# Patient Record
Sex: Male | Born: 1950
Health system: Southern US, Community
[De-identification: ages and names within clinical notes are randomized; demographics above are authoritative.]

## PROBLEM LIST (undated history)

## (undated) DIAGNOSIS — E785 Hyperlipidemia, unspecified: Secondary | ICD-10-CM

## (undated) DIAGNOSIS — I10 Essential (primary) hypertension: Secondary | ICD-10-CM

## (undated) DIAGNOSIS — Z6829 Body mass index (BMI) 29.0-29.9, adult: Secondary | ICD-10-CM

## (undated) DIAGNOSIS — G4700 Insomnia, unspecified: Secondary | ICD-10-CM

## (undated) DIAGNOSIS — N400 Enlarged prostate without lower urinary tract symptoms: Secondary | ICD-10-CM

## (undated) DIAGNOSIS — R001 Bradycardia, unspecified: Secondary | ICD-10-CM

## (undated) DIAGNOSIS — F319 Bipolar disorder, unspecified: Secondary | ICD-10-CM

## (undated) DIAGNOSIS — E876 Hypokalemia: Secondary | ICD-10-CM

## (undated) HISTORY — DX: Bradycardia, unspecified: R00.1

## (undated) HISTORY — DX: Hyperlipidemia, unspecified: E78.5

## (undated) HISTORY — DX: Benign prostatic hyperplasia without lower urinary tract symptoms: N40.0

## (undated) HISTORY — PX: CHOLECYSTECTOMY: SHX55

## (undated) HISTORY — DX: Body mass index (BMI) 29.0-29.9, adult: Z68.29

## (undated) HISTORY — DX: Essential (primary) hypertension: I10

## (undated) HISTORY — DX: Bipolar disorder, unspecified: F31.9

## (undated) HISTORY — PX: APPENDECTOMY: SHX54

## (undated) HISTORY — DX: Hypokalemia: E87.6

## (undated) HISTORY — DX: Insomnia, unspecified: G47.00

---

## 2015-04-17 ENCOUNTER — Encounter: Payer: Self-pay | Admitting: Podiatry

## 2015-04-17 ENCOUNTER — Ambulatory Visit (INDEPENDENT_AMBULATORY_CARE_PROVIDER_SITE_OTHER): Payer: BLUE CROSS/BLUE SHIELD | Admitting: Podiatry

## 2015-04-17 VITALS — BP 149/97 | HR 76 | Resp 14

## 2015-04-17 DIAGNOSIS — B351 Tinea unguium: Secondary | ICD-10-CM

## 2015-04-17 NOTE — Progress Notes (Signed)
   Subjective:    Patient ID: Ronald Kerr, male    DOB: [birth year], 65 y.o.   MRN: [id number]  HPI this patient presents the office with chief complaint of a deformed big toenail, left foot. Patient states that the toenail is unattached for over a year now. He denies any history of trauma or injury to the toenail. He says that his nail has gotten thick and discolored over the last year and presents the office for an evaluation of the nail. He denies any drainage from the site. He presents the office for an evaluation and treatment  The patient presents here today with left great toenail that  Is discolored and thick since 1 year.  Review of Systems  Skin:       Change in nails       Objective:   Physical Exam GENERAL APPEARANCE: Alert, conversant. Appropriately groomed. No acute distress.  VASCULAR: Pedal pulses palpable at  [facility] and PT bilateral.  Capillary refill time is immediate to all digits,  Normal temperature gradient.  Digital hair growth is present bilateral  NEUROLOGIC: sensation is normal to 5.07 monofilament at 5/5 sites bilateral.  Light touch is intact bilateral, Muscle strength normal.  MUSCULOSKELETAL: acceptable muscle strength, tone and stability bilateral.  Intrinsic muscluature intact bilateral.  Rectus appearance of foot and digits noted bilateral.   DERMATOLOGIC: skin color, texture, and turgor are within normal limits.  No preulcerative lesions or ulcers  are seen, no interdigital maceration noted.  No open lesions present.  Digital nails are asymptomatic. No drainage noted. His left hallux toenail has become thick and u-shaped with no evidence of redness or swelling or drainage.         Assessment & Plan:  Onychomycosis Left hallux.  IE  Told patient nail has asumed this position due to previous trauma.  Debrided the nail and discussed the nail   RTC prn   Gardiner Barefoot DPM

## 2016-01-25 DIAGNOSIS — Z1211 Encounter for screening for malignant neoplasm of colon: Secondary | ICD-10-CM | POA: Diagnosis not present

## 2016-01-25 DIAGNOSIS — Z6829 Body mass index (BMI) 29.0-29.9, adult: Secondary | ICD-10-CM | POA: Diagnosis not present

## 2016-01-25 DIAGNOSIS — E785 Hyperlipidemia, unspecified: Secondary | ICD-10-CM | POA: Diagnosis not present

## 2016-01-25 DIAGNOSIS — F319 Bipolar disorder, unspecified: Secondary | ICD-10-CM | POA: Diagnosis not present

## 2016-01-25 DIAGNOSIS — G4700 Insomnia, unspecified: Secondary | ICD-10-CM | POA: Diagnosis not present

## 2016-01-25 DIAGNOSIS — Z79899 Other long term (current) drug therapy: Secondary | ICD-10-CM | POA: Diagnosis not present

## 2016-04-08 DIAGNOSIS — Z1211 Encounter for screening for malignant neoplasm of colon: Secondary | ICD-10-CM | POA: Diagnosis not present

## 2016-04-08 DIAGNOSIS — Z1212 Encounter for screening for malignant neoplasm of rectum: Secondary | ICD-10-CM | POA: Diagnosis not present

## 2016-05-09 DIAGNOSIS — K7689 Other specified diseases of liver: Secondary | ICD-10-CM | POA: Diagnosis not present

## 2016-05-09 DIAGNOSIS — R1084 Generalized abdominal pain: Secondary | ICD-10-CM | POA: Diagnosis not present

## 2016-05-09 DIAGNOSIS — R1033 Periumbilical pain: Secondary | ICD-10-CM | POA: Diagnosis not present

## 2016-05-09 DIAGNOSIS — K8050 Calculus of bile duct without cholangitis or cholecystitis without obstruction: Secondary | ICD-10-CM | POA: Diagnosis not present

## 2016-05-16 DIAGNOSIS — K8010 Calculus of gallbladder with chronic cholecystitis without obstruction: Secondary | ICD-10-CM | POA: Insufficient documentation

## 2016-05-27 DIAGNOSIS — Z79891 Long term (current) use of opiate analgesic: Secondary | ICD-10-CM | POA: Diagnosis not present

## 2016-05-27 DIAGNOSIS — K8010 Calculus of gallbladder with chronic cholecystitis without obstruction: Secondary | ICD-10-CM | POA: Diagnosis not present

## 2016-05-27 DIAGNOSIS — Z79899 Other long term (current) drug therapy: Secondary | ICD-10-CM | POA: Diagnosis not present

## 2016-05-27 DIAGNOSIS — Z9049 Acquired absence of other specified parts of digestive tract: Secondary | ICD-10-CM | POA: Diagnosis not present

## 2016-08-12 DIAGNOSIS — Z1389 Encounter for screening for other disorder: Secondary | ICD-10-CM | POA: Diagnosis not present

## 2016-08-12 DIAGNOSIS — Z79899 Other long term (current) drug therapy: Secondary | ICD-10-CM | POA: Diagnosis not present

## 2016-08-12 DIAGNOSIS — Z6829 Body mass index (BMI) 29.0-29.9, adult: Secondary | ICD-10-CM | POA: Diagnosis not present

## 2016-08-12 DIAGNOSIS — E785 Hyperlipidemia, unspecified: Secondary | ICD-10-CM | POA: Diagnosis not present

## 2016-08-12 DIAGNOSIS — G4700 Insomnia, unspecified: Secondary | ICD-10-CM | POA: Diagnosis not present

## 2016-08-12 DIAGNOSIS — F319 Bipolar disorder, unspecified: Secondary | ICD-10-CM | POA: Diagnosis not present

## 2016-08-12 DIAGNOSIS — Z9181 History of falling: Secondary | ICD-10-CM | POA: Diagnosis not present

## 2016-08-12 DIAGNOSIS — E663 Overweight: Secondary | ICD-10-CM | POA: Diagnosis not present

## 2017-02-05 DIAGNOSIS — G4700 Insomnia, unspecified: Secondary | ICD-10-CM | POA: Diagnosis not present

## 2017-02-05 DIAGNOSIS — F319 Bipolar disorder, unspecified: Secondary | ICD-10-CM | POA: Diagnosis not present

## 2017-02-05 DIAGNOSIS — Z79899 Other long term (current) drug therapy: Secondary | ICD-10-CM | POA: Diagnosis not present

## 2017-02-05 DIAGNOSIS — Z6829 Body mass index (BMI) 29.0-29.9, adult: Secondary | ICD-10-CM | POA: Diagnosis not present

## 2017-02-05 DIAGNOSIS — E785 Hyperlipidemia, unspecified: Secondary | ICD-10-CM | POA: Diagnosis not present

## 2017-08-06 DIAGNOSIS — Z23 Encounter for immunization: Secondary | ICD-10-CM | POA: Diagnosis not present

## 2017-08-06 DIAGNOSIS — Z79899 Other long term (current) drug therapy: Secondary | ICD-10-CM | POA: Diagnosis not present

## 2017-08-06 DIAGNOSIS — F319 Bipolar disorder, unspecified: Secondary | ICD-10-CM | POA: Diagnosis not present

## 2017-08-06 DIAGNOSIS — Z1339 Encounter for screening examination for other mental health and behavioral disorders: Secondary | ICD-10-CM | POA: Diagnosis not present

## 2017-08-06 DIAGNOSIS — E785 Hyperlipidemia, unspecified: Secondary | ICD-10-CM | POA: Diagnosis not present

## 2017-08-06 DIAGNOSIS — G4700 Insomnia, unspecified: Secondary | ICD-10-CM | POA: Diagnosis not present

## 2017-08-06 DIAGNOSIS — Z6828 Body mass index (BMI) 28.0-28.9, adult: Secondary | ICD-10-CM | POA: Diagnosis not present

## 2017-11-21 DIAGNOSIS — Z6827 Body mass index (BMI) 27.0-27.9, adult: Secondary | ICD-10-CM | POA: Diagnosis not present

## 2017-11-21 DIAGNOSIS — K5909 Other constipation: Secondary | ICD-10-CM | POA: Diagnosis not present

## 2017-12-01 DIAGNOSIS — Z6827 Body mass index (BMI) 27.0-27.9, adult: Secondary | ICD-10-CM | POA: Diagnosis not present

## 2017-12-01 DIAGNOSIS — K5909 Other constipation: Secondary | ICD-10-CM | POA: Diagnosis not present

## 2017-12-10 DIAGNOSIS — Z0000 Encounter for general adult medical examination without abnormal findings: Secondary | ICD-10-CM | POA: Diagnosis not present

## 2017-12-10 DIAGNOSIS — Z9181 History of falling: Secondary | ICD-10-CM | POA: Diagnosis not present

## 2017-12-10 DIAGNOSIS — Z139 Encounter for screening, unspecified: Secondary | ICD-10-CM | POA: Diagnosis not present

## 2017-12-10 DIAGNOSIS — E785 Hyperlipidemia, unspecified: Secondary | ICD-10-CM | POA: Diagnosis not present

## 2017-12-10 DIAGNOSIS — Z1331 Encounter for screening for depression: Secondary | ICD-10-CM | POA: Diagnosis not present

## 2017-12-10 DIAGNOSIS — Z23 Encounter for immunization: Secondary | ICD-10-CM | POA: Diagnosis not present

## 2017-12-10 DIAGNOSIS — Z125 Encounter for screening for malignant neoplasm of prostate: Secondary | ICD-10-CM | POA: Diagnosis not present

## 2017-12-15 DIAGNOSIS — Z79899 Other long term (current) drug therapy: Secondary | ICD-10-CM | POA: Diagnosis not present

## 2017-12-15 DIAGNOSIS — E663 Overweight: Secondary | ICD-10-CM | POA: Diagnosis not present

## 2017-12-15 DIAGNOSIS — K5909 Other constipation: Secondary | ICD-10-CM | POA: Diagnosis not present

## 2017-12-15 DIAGNOSIS — Z6827 Body mass index (BMI) 27.0-27.9, adult: Secondary | ICD-10-CM | POA: Diagnosis not present

## 2018-01-04 DIAGNOSIS — R1030 Lower abdominal pain, unspecified: Secondary | ICD-10-CM | POA: Diagnosis not present

## 2018-01-04 DIAGNOSIS — R1084 Generalized abdominal pain: Secondary | ICD-10-CM | POA: Diagnosis not present

## 2018-01-04 DIAGNOSIS — K5900 Constipation, unspecified: Secondary | ICD-10-CM | POA: Diagnosis not present

## 2018-02-05 DIAGNOSIS — F319 Bipolar disorder, unspecified: Secondary | ICD-10-CM | POA: Diagnosis not present

## 2018-02-05 DIAGNOSIS — Z79899 Other long term (current) drug therapy: Secondary | ICD-10-CM | POA: Diagnosis not present

## 2018-02-05 DIAGNOSIS — F419 Anxiety disorder, unspecified: Secondary | ICD-10-CM | POA: Diagnosis not present

## 2018-02-05 DIAGNOSIS — K5909 Other constipation: Secondary | ICD-10-CM | POA: Diagnosis not present

## 2018-02-05 DIAGNOSIS — G4700 Insomnia, unspecified: Secondary | ICD-10-CM | POA: Diagnosis not present

## 2018-02-05 DIAGNOSIS — Z6827 Body mass index (BMI) 27.0-27.9, adult: Secondary | ICD-10-CM | POA: Diagnosis not present

## 2018-02-05 DIAGNOSIS — E785 Hyperlipidemia, unspecified: Secondary | ICD-10-CM | POA: Diagnosis not present

## 2018-03-05 DIAGNOSIS — K5900 Constipation, unspecified: Secondary | ICD-10-CM | POA: Diagnosis not present

## 2018-04-02 DIAGNOSIS — K581 Irritable bowel syndrome with constipation: Secondary | ICD-10-CM | POA: Diagnosis not present

## 2018-08-06 DIAGNOSIS — Z131 Encounter for screening for diabetes mellitus: Secondary | ICD-10-CM | POA: Diagnosis not present

## 2018-08-06 DIAGNOSIS — E785 Hyperlipidemia, unspecified: Secondary | ICD-10-CM | POA: Diagnosis not present

## 2018-08-06 DIAGNOSIS — F419 Anxiety disorder, unspecified: Secondary | ICD-10-CM | POA: Diagnosis not present

## 2018-08-06 DIAGNOSIS — Z6827 Body mass index (BMI) 27.0-27.9, adult: Secondary | ICD-10-CM | POA: Diagnosis not present

## 2018-08-06 DIAGNOSIS — Z79899 Other long term (current) drug therapy: Secondary | ICD-10-CM | POA: Diagnosis not present

## 2018-08-06 DIAGNOSIS — N400 Enlarged prostate without lower urinary tract symptoms: Secondary | ICD-10-CM | POA: Diagnosis not present

## 2018-08-06 DIAGNOSIS — G4700 Insomnia, unspecified: Secondary | ICD-10-CM | POA: Diagnosis not present

## 2018-08-06 DIAGNOSIS — F319 Bipolar disorder, unspecified: Secondary | ICD-10-CM | POA: Diagnosis not present

## 2018-08-06 DIAGNOSIS — E663 Overweight: Secondary | ICD-10-CM | POA: Diagnosis not present

## 2018-10-21 DIAGNOSIS — Z79899 Other long term (current) drug therapy: Secondary | ICD-10-CM | POA: Diagnosis not present

## 2018-10-21 DIAGNOSIS — G20 Parkinson's disease: Secondary | ICD-10-CM | POA: Diagnosis not present

## 2018-10-21 DIAGNOSIS — F319 Bipolar disorder, unspecified: Secondary | ICD-10-CM | POA: Diagnosis not present

## 2018-10-21 DIAGNOSIS — Z6826 Body mass index (BMI) 26.0-26.9, adult: Secondary | ICD-10-CM | POA: Diagnosis not present

## 2018-11-19 DIAGNOSIS — G20 Parkinson's disease: Secondary | ICD-10-CM | POA: Diagnosis not present

## 2018-11-19 DIAGNOSIS — Z79899 Other long term (current) drug therapy: Secondary | ICD-10-CM | POA: Diagnosis not present

## 2018-11-19 DIAGNOSIS — Z6826 Body mass index (BMI) 26.0-26.9, adult: Secondary | ICD-10-CM | POA: Diagnosis not present

## 2018-12-03 DIAGNOSIS — Z5320 Procedure and treatment not carried out because of patient's decision for unspecified reasons: Secondary | ICD-10-CM | POA: Diagnosis not present

## 2018-12-03 DIAGNOSIS — K581 Irritable bowel syndrome with constipation: Secondary | ICD-10-CM | POA: Diagnosis not present

## 2018-12-10 ENCOUNTER — Encounter: Payer: Self-pay | Admitting: Neurology

## 2018-12-10 ENCOUNTER — Telehealth: Payer: Self-pay

## 2018-12-10 ENCOUNTER — Ambulatory Visit: Payer: PPO | Admitting: Neurology

## 2018-12-10 ENCOUNTER — Other Ambulatory Visit: Payer: Self-pay

## 2018-12-10 VITALS — BP 168/83 | HR 64 | Temp 98.2°F | Ht 73.0 in | Wt 199.0 lb

## 2018-12-10 DIAGNOSIS — G2119 Other drug induced secondary parkinsonism: Secondary | ICD-10-CM

## 2018-12-10 NOTE — Telephone Encounter (Signed)
Pt completed consent for insurance auth for DaT Scan. Given to [place] [facility] for processing.

## 2018-12-10 NOTE — Patient Instructions (Signed)
You have a mild degree of parkinsonism, changes in your gait and posture, intermittent tremors. I think you may have developed parkinsonian features from taking the Zyprexa for years.  Even her new medication, Vraylar May cause parkinsonism.  Your history and your examination on telltale for what we call idiopathic Parkinson's disease, or "real" (true) Parkinson's disease.  Please remember to stay well-hydrated with water and well rested. Talk to your primary care physician, Dr. Megan Salon about medical management.  Perhaps a consultation with a geriatric psychiatrist would help. I would like to proceed with a DaT scan: This is a specialized brain scan designed to help with diagnosis of tremor disorders. A radioactive marker gets injected and the uptake is measured in the brain and compared to normal controls and right side is compared to the left, a change in uptake can help with diagnosis of certain tremor disorders. A brain MRI on the other hand is a brain scan that helps look at the brain structure in more detail overall and look for age-related changes, blood vessel related changes and look for stroke and volume loss which we call atrophy. I would like to reevaluate you in 3 months.

## 2018-12-10 NOTE — Progress Notes (Signed)
Subjective:    Patient ID: Ronald Kerr is a 68 y.o. male.  HPI     Star Age, MD, PhD [facility] [address] [place], [place] [address]  Dear Dr. Megan Salon, I saw your patient, Ronald Kerr, upon your kind request to my neurologic clinic today for initial consultation of his parkinsonism.  The patient is unaccompanied today.  As you know, [name] is a 68 year old right-handed gentleman with an underlying medical history of bipolar disease, hyperlipidemia, BPH, overweight state, and insomnia, who reports a several month history of difficulty with his gait and balance and trembling.  His wife provides most of his information.  Patient is very quiet and minimal in his information, he is slow to respond, wife reports that he has had progressive issues with slowness and difficulty with fine motor skills for months, if not years.  Of note, he has been on Zyprexa for at least 5 years and this was recently stopped.  He was on Zyprexa even 10 years ago but then stopped it and restarted it.  I reviewed your office note from 10/21/2018.  He had blood work through your office which I reviewed: CBC with differential was unremarkable, CMP showed BUN of 17 and creatinine of 0.98.  TSH was 1.17.  Of note, he has been started on Vraylar some 6 weeks ago or so.  He has also been started on Sinemet which is currently 25-100 mg strength 1 pill 3 times daily.  He has not noticed any significant improvement since starting the Sinemet, his wife believes that perhaps his tremor is a little bit better.  He has trembling in all 4 extremities, primarily in the legs.  He has not fallen recently but felt some months ago.  He is no longer on Prozac.  He has no family history of tremors or Parkinson's disease.  He lives with his wife.  He had a son from his previous marriage, son passed away at age 4 from a ruptured aortic aneurysm.  The patient does not hydrate well with water, he  and his wife estimates that he drinks maybe 1 cup of water per day, he drinks soda, 1 can/day and some milk with cereal in the morning.  She reports that he has ongoing issues with panic and anxiety and depression, lack of motivation.   He is a non-smoker, he does not currently drink any alcohol with the exception of a rare beer.  He is retired, used to Psychologist, forensic.  His Past Medical History Is Significant For: Past Medical History:  Diagnosis Date  . Bipolar disorder ([place])   . BPH without obstruction/lower urinary tract symptoms   . Hyperlipidemia   . Insomnia     His Past Surgical History Is Significant For: Past Surgical History:  Procedure Laterality Date  . APPENDECTOMY    . CHOLECYSTECTOMY      His Family History Is Significant For: Family History  Problem Relation Age of Onset  . Breast cancer Mother     His Social History Is Significant For: Social History   Socioeconomic History  . Marital status: Married    Spouse name: Not on file  . Number of children: Not on file  . Years of education: Not on file  . Highest education level: Not on file  Occupational History  . Not on file  Social Needs  . Financial resource strain: Not on file  . Food insecurity    Worry: Not on  file    Inability: Not on file  . Transportation needs    Medical: Not on file    Non-medical: Not on file  Tobacco Use  . Smoking status: Never Smoker  . Smokeless tobacco: Never Used  Substance and Sexual Activity  . Alcohol use: Yes    Alcohol/week: 0.0 standard drinks    Comment: occas.  . Drug use: No  . Sexual activity: Not on file  Lifestyle  . Physical activity    Days per week: Not on file    Minutes per session: Not on file  . Stress: Not on file  Relationships  . Social Herbalist on phone: Not on file    Gets together: Not on file    Attends religious service: Not on file    Active member of club or organization: Not on file    Attends meetings  of clubs or organizations: Not on file    Relationship status: Not on file  Other Topics Concern  . Not on file  Social History Narrative  . Not on file    His Allergies Are:  No Known Allergies:   His Current Medications Are:  Outpatient Encounter Medications as of 12/10/2018  Medication Sig  . carbidopa-levodopa (SINEMET IR) 25-100 MG tablet Take 1 tablet by mouth 3 (three) times daily.  . Cariprazine HCl (VRAYLAR) 4.5 MG CAPS Take 4.5 mg by mouth daily.  Marland Kitchen linaclotide (LINZESS) 145 MCG CAPS capsule Take 145 mcg by mouth daily before breakfast.  . rosuvastatin (CRESTOR) 10 MG tablet Take 10 mg by mouth daily.  . [DISCONTINUED] aspirin 81 MG tablet Take 81 mg by mouth daily.  . [DISCONTINUED] FLUoxetine (PROZAC) 10 MG tablet   . [DISCONTINUED] OLANZapine (ZYPREXA) 10 MG tablet Take 10 mg by mouth at bedtime.   No facility-administered encounter medications on file as of 12/10/2018.   :   Review of Systems:  Out of a complete 14 point review of systems, all are reviewed and negative with the exception of these symptoms as listed below:  Review of Systems  Neurological:       Pt presents today to discuss if he has parkinson's disease. Pt was started on sinemet by his PCP. Pt's wife believes that his helps a little. Pt notices tremors in both of his hands. Pt is right handed. Pt stopped zyprexa about one month ago and started vraylar.    Objective:  Neurological Exam  Physical Exam Physical Examination:   Vitals:   12/10/18 1001  BP: (!) 168/83  Pulse: 64  Temp: 98.2 F (36.8 C)   General Examination: The patient is a very pleasant 68 y.o. male in no acute distress. He appears Mildly deconditioned, adequately groomed, very quiet, overall psychomotor retardation noted.  HEENT: Normocephalic, atraumatic, pupils are equal, round and reactive to light and accommodation. Extraocular tracking shows mild saccadic breakdown.  He has mild facial masking, he has mild nuchal  rigidity.  No carotid bruits.  Airway examination reveals moderate mouth dryness, tongue protrudes centrally in palate elevates symmetrically.  He has no lip, neck or jaw tremor.  No dysarthria, speech is scant.   Chest: Clear to auscultation without wheezing, rhonchi or crackles noted.  Heart: S1+S2+0, regular and normal without murmurs, rubs or gallops noted.   Abdomen: Soft, non-tender and non-distended with normal bowel sounds appreciated on auscultation.  Extremities: There is no pitting edema in the distal lower extremities bilaterally.  Skin: Warm and dry without trophic changes noted.  Musculoskeletal: exam reveals no obvious joint deformities, tenderness or joint swelling or erythema.   Neurologically:  Mental status: The patient is awake and alert, oriented, he is not able to provide a lot of details about his history.  His history is primarily provided by his wife, his mood is constricted, affect is quite blunted.  Cranial nerves are as above.   On 12/10/2018: On Archimedes spiral drawing he has no significant trembling with either hand, handwriting with The right hand which is his dominant hand is legible, not tremulous, not particularly micrographic. Motor examination reveals no obvious resting tremor in the upper extremities, he has a mildly increased tone in both upper extremities, he has an intermittent trembling in both feet but at times this appears to be more in keeping with motor restlessness or akathisia and sometimes it resembles volitional foot bobbing. Normal strength is noted, reflexes are 1+, toes are downgoing, fine motor skills and coordination: He has mild difficulty with finger taps, hand movements and rapid alternating padding, overall mild slowness is noted, no obvious lateralization.  Cerebellar testing with finger-to-nose and heel-to-shin shows no dysmetria or intention tremor.  Sensory exam: intact to light touch in the upper and lower extremities.  Gait, station  and balance: He stands up without difficulty, he is able to stand narrow based.  Romberg is negative.  His posture is mildly stooped for age, he walks slowly and has decreased arm swing bilaterally.  No obvious shuffling.  Assessment and Plan:  Assessment and Plan:  In summary, Levere Fahrer is a very pleasant 67 y.o.-year old male with an underlying medical history of bipolar disease, hyperlipidemia, BPH, overweight state, and insomnia, who Presents for evaluation of his gait, balance changes and fine motor difficulties, his history and examination are in keeping with mild degree of parkinsonism.  His clinical picture is overshadowed by Likely residual depression and anxiety.The lack of telltale  Concerning for drug-induced parkinsonism, lateralization and his presentation overall are more concerning for a drug-induced presentation rather than idiopathic Parkinson's disease.  He is advised to talk to you about medical management and potentially the possibility of seeing a geriatric psychiatrist for management of his mood disorder and alternative treatment options.He has been on Zyprexa for many years, was recently switched to [employer]. I did not suggest any new medications today.  He is not sure that he has benefited from Sinemet which is currently 3 times daily.  I would like to proceed with a special nuclear medicine brain scan called DaTscan.  We provided additional information and He is advised that this requires insurance authorization as well as a consent. Written consent was obtained today.  He and his wife are willing to proceed and we will keep him posted as to the test results.  I plan to reevaluate him in 3 months, sooner if needed.  I answered all their questions today and the patient and his wife were in agreement. Thank you very much for allowing me to participate in the care of this nice patient. If I can be of any further assistance to you please do not hesitate to call me at  [phone].  Sincerely,   Star Age, MD, PhD

## 2018-12-14 ENCOUNTER — Telehealth: Payer: Self-pay | Admitting: Neurology

## 2018-12-14 NOTE — Telephone Encounter (Signed)
Noted. Thanks Emily.  

## 2018-12-14 NOTE — Telephone Encounter (Signed)
I faxed all the information for the DAT scan. It is pending.

## 2018-12-15 DIAGNOSIS — Z0000 Encounter for general adult medical examination without abnormal findings: Secondary | ICD-10-CM | POA: Diagnosis not present

## 2018-12-15 DIAGNOSIS — Z9181 History of falling: Secondary | ICD-10-CM | POA: Diagnosis not present

## 2018-12-15 DIAGNOSIS — Z1331 Encounter for screening for depression: Secondary | ICD-10-CM | POA: Diagnosis not present

## 2018-12-15 DIAGNOSIS — Z139 Encounter for screening, unspecified: Secondary | ICD-10-CM | POA: Diagnosis not present

## 2018-12-15 DIAGNOSIS — E785 Hyperlipidemia, unspecified: Secondary | ICD-10-CM | POA: Diagnosis not present

## 2018-12-15 DIAGNOSIS — Z125 Encounter for screening for malignant neoplasm of prostate: Secondary | ICD-10-CM | POA: Diagnosis not present

## 2019-01-07 ENCOUNTER — Ambulatory Visit (HOSPITAL_COMMUNITY): Admission: RE | Admit: 2019-01-07 | Payer: PPO | Source: Ambulatory Visit

## 2019-01-07 ENCOUNTER — Encounter (HOSPITAL_COMMUNITY): Payer: PPO

## 2019-02-22 DIAGNOSIS — G20 Parkinson's disease: Secondary | ICD-10-CM | POA: Diagnosis not present

## 2019-02-22 DIAGNOSIS — E785 Hyperlipidemia, unspecified: Secondary | ICD-10-CM | POA: Diagnosis not present

## 2019-02-22 DIAGNOSIS — N400 Enlarged prostate without lower urinary tract symptoms: Secondary | ICD-10-CM | POA: Diagnosis not present

## 2019-02-22 DIAGNOSIS — Z79899 Other long term (current) drug therapy: Secondary | ICD-10-CM | POA: Diagnosis not present

## 2019-02-22 DIAGNOSIS — Z6826 Body mass index (BMI) 26.0-26.9, adult: Secondary | ICD-10-CM | POA: Diagnosis not present

## 2019-02-22 DIAGNOSIS — F319 Bipolar disorder, unspecified: Secondary | ICD-10-CM | POA: Diagnosis not present

## 2019-03-15 ENCOUNTER — Telehealth: Payer: Self-pay | Admitting: Neurology

## 2019-03-15 NOTE — Telephone Encounter (Signed)
I reached out to the pt's wife. She st's the pt would like to proceed with the DAT testing at this time. She sts back in Oct of 2020 the pt declined scheduling because he felt like he did not need it and he was getting better. Pt has since declined and would like to pursue DAT Scan. I advised I would send a message to Hinton Dyer asking if the pt consent from Oct of 2020 was still in effect so we could proceed with scheduling.  Pt has canceled f/u scheduled for 03/16/2019 until testing is completed.

## 2019-03-15 NOTE — Telephone Encounter (Signed)
Voeltz,Laura(wife on DPR) has called stating pt is not getting any better and they would like to go about having pt rescheduled for his nuclear brain scan.  Please call

## 2019-03-16 ENCOUNTER — Ambulatory Visit: Payer: PPO | Admitting: Neurology

## 2019-03-16 NOTE — Telephone Encounter (Signed)
Please ask patient how he would like to proceed, they can come in for consent and talk to you directly Hinton Dyer or we may have to mail the form to them and do the consent over the Phone, if you have done that before.

## 2019-03-16 NOTE — Telephone Encounter (Signed)
Noted I will take care of it . Thanks Hinton Dyer

## 2019-05-24 DIAGNOSIS — E785 Hyperlipidemia, unspecified: Secondary | ICD-10-CM | POA: Diagnosis not present

## 2019-05-24 DIAGNOSIS — G20 Parkinson's disease: Secondary | ICD-10-CM | POA: Diagnosis not present

## 2019-05-24 DIAGNOSIS — F319 Bipolar disorder, unspecified: Secondary | ICD-10-CM | POA: Diagnosis not present

## 2019-05-24 DIAGNOSIS — N400 Enlarged prostate without lower urinary tract symptoms: Secondary | ICD-10-CM | POA: Diagnosis not present

## 2019-05-24 DIAGNOSIS — Z6826 Body mass index (BMI) 26.0-26.9, adult: Secondary | ICD-10-CM | POA: Diagnosis not present

## 2019-05-24 DIAGNOSIS — Z79899 Other long term (current) drug therapy: Secondary | ICD-10-CM | POA: Diagnosis not present

## 2019-05-24 NOTE — Telephone Encounter (Signed)
Re- sending to insurance

## 2019-05-24 NOTE — Telephone Encounter (Signed)
Patient wife called back and relayed that she does want to start process of Dat scan now I relayed to patient's wife that this process may take up to two weeks because we will have to resubmit to insurance. [id number] .

## 2019-06-09 NOTE — Telephone Encounter (Signed)
Patient was approved Ronald Kerr DAT scan . Lovena Le has called patient to schedule.

## 2019-06-24 ENCOUNTER — Telehealth: Payer: Self-pay

## 2019-06-24 DIAGNOSIS — Z79899 Other long term (current) drug therapy: Secondary | ICD-10-CM | POA: Diagnosis not present

## 2019-06-24 DIAGNOSIS — G2401 Drug induced subacute dyskinesia: Secondary | ICD-10-CM | POA: Diagnosis not present

## 2019-06-24 DIAGNOSIS — Z6827 Body mass index (BMI) 27.0-27.9, adult: Secondary | ICD-10-CM | POA: Diagnosis not present

## 2019-06-24 NOTE — Telephone Encounter (Signed)
Call transferred from phone room staff. I spoke with Tammy with Dr. Chilton [facility]ath[facility] office. She reports the pt is currently at their office and wanted if the appt for the DAT scan has been scheduled? I reviewed the pt's chart and found where insurance has approved the scan but could not locate a date/time for the appt. I advised Tammy I would send message to referral coordinator to see about getting this scheduled.

## 2019-06-24 NOTE — Telephone Encounter (Signed)
Called and left patient a message to please call taylor at [facility] to schedule his DAT scan [phone]

## 2019-06-28 NOTE — Telephone Encounter (Signed)
Phone rep checked office voicemail's,at 9:19 pt's wife(Laura Hudelson) left a voicemail asking that [place] calls her back re: the scheduling of the DAT scan

## 2019-06-28 NOTE — Telephone Encounter (Signed)
Called back and left another message relaying for Ronald Kerr to call [phone] that's who she needs to call to get DAT scan scheduled.

## 2019-07-29 ENCOUNTER — Other Ambulatory Visit: Payer: Self-pay

## 2019-07-29 ENCOUNTER — Ambulatory Visit (HOSPITAL_COMMUNITY)
Admission: RE | Admit: 2019-07-29 | Discharge: 2019-07-29 | Disposition: A | Discharge status: Home / Self Care | Payer: PPO | Source: Ambulatory Visit | Attending: Neurology | Admitting: Neurology

## 2019-07-29 ENCOUNTER — Encounter (HOSPITAL_COMMUNITY)
Admission: RE | Admit: 2019-07-29 | Discharge: 2019-07-29 | Disposition: A | Discharge status: Home / Self Care | Payer: PPO | Source: Ambulatory Visit | Attending: Neurology | Admitting: Neurology

## 2019-07-29 DIAGNOSIS — G2119 Other drug induced secondary parkinsonism: Secondary | ICD-10-CM | POA: Insufficient documentation

## 2019-07-29 DIAGNOSIS — R251 Tremor, unspecified: Secondary | ICD-10-CM | POA: Diagnosis not present

## 2019-07-29 MED ORDER — IOFLUPANE I 123 185 MBQ/2.5ML IV SOLN
5.0100 | Freq: Once | INTRAVENOUS | Status: AC
  Administered 2019-07-29: 5.01 via INTRAVENOUS

## 2019-07-29 MED ORDER — IODINE STRONG (LUGOLS) 5 % PO SOLN
ORAL | Status: AC
  Administered 2019-07-29: 0.8 mL via ORAL
  Filled 2019-07-29: qty 1

## 2019-08-04 ENCOUNTER — Telehealth: Payer: Self-pay | Admitting: Neurology

## 2019-08-04 NOTE — Telephone Encounter (Signed)
Pt's wife Mickel Baas on [place] called wanting to know if the DAT Scan results have been received. Please advise.

## 2019-08-04 NOTE — Telephone Encounter (Signed)
I reached out out to the pt's wife and advised we have not received the final report yet but would let the pt know once we do. I also advised Dr. Rexene Alberts is out of the office until 6/21.  She verbalized understanding and appreciation for the call.

## 2019-08-05 NOTE — Telephone Encounter (Signed)
Please call patient or his wife regarding his DaTscan.  Scan results would  support underlying parkinsonism, possible Parkinson's disease.  If he had drug-induced parkinsonism his scan results should be fairly normal but his result, meaning the uptake of the radioactive marker was abnormal, which would support an underlying primary Parkinsonian syndrome. Please advise patient to discuss further during a FU appt and assist with making the appt.

## 2019-08-09 NOTE — Telephone Encounter (Signed)
I contacted the pt's wife and advised of results. She verbalized understanding and pt scheduled f/u for 08/10/2019 at 300 pm check in of 230.

## 2019-08-10 ENCOUNTER — Ambulatory Visit: Payer: PPO | Admitting: Neurology

## 2019-08-10 ENCOUNTER — Encounter: Payer: Self-pay | Admitting: Neurology

## 2019-08-10 VITALS — BP 158/87 | HR 54 | Ht 73.0 in | Wt 211.0 lb

## 2019-08-10 DIAGNOSIS — F419 Anxiety disorder, unspecified: Secondary | ICD-10-CM | POA: Diagnosis not present

## 2019-08-10 DIAGNOSIS — F329 Major depressive disorder, single episode, unspecified: Secondary | ICD-10-CM

## 2019-08-10 DIAGNOSIS — G2119 Other drug induced secondary parkinsonism: Secondary | ICD-10-CM

## 2019-08-10 NOTE — Progress Notes (Signed)
Subjective:    Patient ID: Ronald Kerr is a 69 y.o. male.  HPI     Interim history:   [name] is a 69 year old right-handed gentleman with an underlying medical history of bipolar disease, hyperlipidemia, BPH, overweight state, and insomnia, who Presents for follow-up consultation of his parkinsonism.  The patient is accompanied by his wife today.  I first met him on 12/10/2018 at the request of his primary care physician, at which time he reported a several month history of slowness, difficulty with fine motor skills, progression of balance and gait disorder and trembling.  He had evidence of parkinsonism, concerning for drug-induced parkinsonism in particular as he had been on Zyprexa for years.  I suggested we proceed with a DaTscan.  He had been on Sinemet per PCP with no telltale results.   He had a DaT scan on 07/29/19 and I reviewed the results: IMPRESSION: Asymmetric decreased striatal Ioflupane activity as above. This pattern can be seen in Parkinson's syndromes.   Of note, DaTSCAN is not diagnostic of Parkinson's syndromes. DaTscan is an adjuvant test to aid in the clinical diagnosis of Parkinson's syndrome. We called his wife with the results.   Today, 08/10/19: he reports feeling worse. He reports that his anxiety is worse.  He was stressed out about coming to the appointment today.  His wife reports that he was afraid he was going to have a panic attack.  He took 2 pills of clonazepam today.  He takes it as needed.  He has stopped all his medications including his Prozac and his Vraylar and is no longer on Zyprexa, he also stopped his Sinemet shortly after his appointment last year with me.  He stopped all his medications without asking his primary care physician.  His wife reports that they did tell Dr. Megan Salon that patient stopped all his medications.  They were supposed to see primary care physician earlier this month but the appointment was canceled twice per wife.  They do  have an appointment to see primary care physician next month.  The patient is visibly restless, has noisy breathing, reports that he feels stressed out and anxious.  He denies any significant depression at this time.  She believes that his anxiety is his main issue today.  He has not had much in the way of tremor.  Previously:   12/10/18: (He) reports a several month history of difficulty with his gait and balance and trembling.  His wife provides most of his information.  Patient is very quiet and minimal in his information, he is slow to respond, wife reports that he has had progressive issues with slowness and difficulty with fine motor skills for months, if not years.  Of note, he has been on Zyprexa for at least 5 years and this was recently stopped.  He was on Zyprexa even 10 years ago but then stopped it and restarted it.  I reviewed your office note from 10/21/2018.  He had blood work through your office which I reviewed: CBC with differential was unremarkable, CMP showed BUN of 17 and creatinine of 0.98.  TSH was 1.17.  Of note, he has been started on Vraylar some 6 weeks ago or so.  He has also been started on Sinemet which is currently 25-100 mg strength 1 pill 3 times daily.  He has not noticed any significant improvement since starting the Sinemet, his wife believes that perhaps his tremor is a little bit better.  He has trembling in all 4  extremities, primarily in the legs.  He has not fallen recently but felt some months ago.  He is no longer on Prozac.  He has no family history of tremors or Parkinson's disease.  He lives with his wife.  He had a son from his previous marriage, son passed away at age 50 from a ruptured aortic aneurysm.  The patient does not hydrate well with water, he and his wife estimates that he drinks maybe 1 cup of water per day, he drinks soda, 1 can/day and some milk with cereal in the morning.  She reports that he has ongoing issues with panic and anxiety and depression,  lack of motivation.   He is a non-smoker, he does not currently drink any alcohol with the exception of a rare beer.  He is retired, used to Psychologist, forensic.  His Past Medical History Is Significant For: Past Medical History:  Diagnosis Date  . Bipolar disorder ([place])   . BPH without obstruction/lower urinary tract symptoms   . Hyperlipidemia   . Insomnia     His Past Surgical History Is Significant For: Past Surgical History:  Procedure Laterality Date  . APPENDECTOMY    . CHOLECYSTECTOMY      His Family History Is Significant For: Family History  Problem Relation Age of Onset  . Breast cancer Mother     His Social History Is Significant For: Social History   Socioeconomic History  . Marital status: Married    Spouse name: Not on file  . Number of children: Not on file  . Years of education: Not on file  . Highest education level: Not on file  Occupational History  . Not on file  Tobacco Use  . Smoking status: Never Smoker  . Smokeless tobacco: Never Used  Substance and Sexual Activity  . Alcohol use: Yes    Alcohol/week: 0.0 standard drinks    Comment: occas.  . Drug use: No  . Sexual activity: Not on file  Other Topics Concern  . Not on file  Social History Narrative  . Not on file   Social Determinants of Health   Financial Resource Strain:   . Difficulty of Paying Living Expenses:   Food Insecurity:   . Worried About Charity fundraiser in the Last Year:   . Arboriculturist in the Last Year:   Transportation Needs:   . Film/video editor (Medical):   Marland Kitchen Lack of Transportation (Non-Medical):   Physical Activity:   . Days of Exercise per Week:   . Minutes of Exercise per Session:   Stress:   . Feeling of Stress :   Social Connections:   . Frequency of Communication with Friends and Family:   . Frequency of Social Gatherings with Friends and Family:   . Attends Religious Services:   . Active Member of Clubs or Organizations:   .  Attends Archivist Meetings:   Marland Kitchen Marital Status:     His Allergies Are:  No Known Allergies:   His Current Medications Are:  Outpatient Encounter Medications as of 08/10/2019  Medication Sig  . rosuvastatin (CRESTOR) 10 MG tablet Take 10 mg by mouth daily.  . [DISCONTINUED] carbidopa-levodopa (SINEMET IR) 25-100 MG tablet Take 1 tablet by mouth 3 (three) times daily.  . [DISCONTINUED] Cariprazine HCl (VRAYLAR) 4.5 MG CAPS Take 4.5 mg by mouth daily.  . [DISCONTINUED] linaclotide (LINZESS) 145 MCG CAPS capsule Take 145 mcg by mouth daily before breakfast.   No  facility-administered encounter medications on file as of 08/10/2019.  :  Review of Systems:  Out of a complete 14 point review of systems, all are reviewed and negative with the exception of these symptoms as listed below: Review of Systems  Neurological:       Here today to follow up on his DAT test results. States his symptoms have not changed since last visit. Per his wife, his anxiety has increased since the last visit. Has stopped several of his medications.     Objective:  Neurological Exam  Physical Exam Physical Examination:   Vitals:   08/10/19 1515  BP: (!) 158/87  Pulse: (!) 54    General Examination: The patient is a very pleasant 69 y.o. male in no acute distress. He appears restless and mildly agitated, he appears anxious.  He is more verbal and interactive actually than last time.   HEENT: Normocephalic, atraumatic, pupils are equal, round and reactive to light and accommodation. Extraocular tracking shows mild saccadic breakdown.  He has mild facial masking, he has mild nuchal rigidity.  No carotid bruits.  Airway examination reveals mild mouth dryness, tongue protrudes centrally in palate elevates symmetrically.  He has no lip, neck or jaw tremor.  No dysarthria, speech is normal, a little bit more talkative than last time.  No obvious tongue dyskinesias, he breathes through his mouth.   Chest:  Clear to auscultation without wheezing, rhonchi or crackles noted.  Heart: S1+S2+0, regular and normal without murmurs, rubs or gallops noted.   Abdomen: Soft, non-tender and non-distended.  Extremities: There is no edema in the distal lower extremities bilaterally.  Skin: Warm and dry without trophic changes noted.  Musculoskeletal: exam reveals no obvious joint deformities, tenderness or joint swelling or erythema.   Neurologically:  Mental status: The patient is awake and alert, more interactive and more talkative compared to last time.   Cranial nerves are as above.   (On 12/10/2018: On Archimedes spiral drawing he has no significant trembling with either hand, handwriting with The right hand which is his dominant hand is legible, not tremulous, not particularly micrographic.)   Motor examination reveals no obvious resting tremor in the upper extremities, has no significant increase in tone, no significant postural tremor, no obvious resting tremor.  He is somewhat restless motor wise generally speaking Fine motor skills are not significantly impaired and no obvious lateralization is noted.  He stands up somewhat slowly and stands slightly wide-based, he walks with preserved arm swing, no shuffling noted.   Sensory exam: intact to light touch in the upper and lower extremities.   Assessment and Plan:  In summary, Erle Guster is a very pleasant 69 year old male with an underlying medical history of bipolar disease, hyperlipidemia, BPH, overweight state, and insomnia, who Presents for follow-up consultation of his gait and balance problems and fine motor difficulties as well as trembling.  Last year in October he had some signs of parkinsonism on examination which are not apparent today.  He certainly does not have any significant parkinsonian symptoms or telltale lateralization.  I was suspecting drug-induced parkinsonism last year.  We recently did a DaTscan which showed  abnormality in the uptake.  I am perplexed by the results especially since clinically he does not have any obvious parkinsonism or classic signs of idiopathic Parkinson's disease today.  I would like to continue to monitor his symptoms and examination.  More pressing issue currently is his increase in anxiety and some depression.  His anxiety and  stress level have certainly increased.  He is strongly discouraged from discontinuing psychotropic medications abruptly.  It sounds like he stopped most of his medications last year.  He has an appointment with his primary care physician coming up earlier in the month next month but they are encouraged to call Dr. Hale Bogus office for sooner than scheduled appointment if possible as he is endorsing quite a bit of stress and increase in anxiety since stopping his medications.  He has also stopped Sinemet which he was on for a short period of time last year per PCP.  I would favor that he continues to stay off of Sinemet for now.  I would like to reevaluate him in about 3 to 4 months, sooner if needed.  He was offered a referral to psychiatry to facilitate help for his mood disorder but he declined it and his wife is also declining this today.   When he was on Sinemet last year he does not remember that it helped him.  He reported last year that he did not notice any significant benefit from it.   We mutually agreed to follow him clinically.  His wife was agreeable to call primary care physician today to see if he can be seen sooner than scheduled. I answered all their questions today and the patient and his wife were in agreement. I spent 20 minutes in total face-to-face time and in reviewing records during pre-charting, more than 50% of which was spent in counseling and coordination of care, reviewing test results, reviewing medications and treatment regimen and/or in discussing or reviewing the diagnosis of drug-induced parkinsonism, the prognosis and treatment  options. Pertinent laboratory and imaging test results that were available during this visit with the patient were reviewed by me and considered in my medical decision making (see chart for details).

## 2019-08-10 NOTE — Patient Instructions (Signed)
Even though your DaTscan was reported to be abnormal and supportive of an underlying Parkinson's syndrome I do not see any significant signs of parkinsonism today or classic signs of Parkinson's disease.  Therefore, as explained I would like to continue to monitor your symptoms and your examination.  Please make a follow-up appointment with your primary care physician, Dr. Megan Salon as soon as possible as your anxiety and depression have increased since you stopped your medications last year.  I think managing your anxiety and depression are much more pressing issues currently.  Please follow-up with me in about 4 months for a reevaluation of your neurological symptoms and signs.

## 2019-08-16 DIAGNOSIS — Z6827 Body mass index (BMI) 27.0-27.9, adult: Secondary | ICD-10-CM | POA: Diagnosis not present

## 2019-08-16 DIAGNOSIS — F319 Bipolar disorder, unspecified: Secondary | ICD-10-CM | POA: Diagnosis not present

## 2019-08-16 DIAGNOSIS — F411 Generalized anxiety disorder: Secondary | ICD-10-CM | POA: Diagnosis not present

## 2019-08-16 DIAGNOSIS — Z79899 Other long term (current) drug therapy: Secondary | ICD-10-CM | POA: Diagnosis not present

## 2019-10-19 DIAGNOSIS — F319 Bipolar disorder, unspecified: Secondary | ICD-10-CM | POA: Diagnosis not present

## 2019-10-19 DIAGNOSIS — Z79899 Other long term (current) drug therapy: Secondary | ICD-10-CM | POA: Diagnosis not present

## 2019-10-19 DIAGNOSIS — Z6827 Body mass index (BMI) 27.0-27.9, adult: Secondary | ICD-10-CM | POA: Diagnosis not present

## 2019-10-19 DIAGNOSIS — F0390 Unspecified dementia without behavioral disturbance: Secondary | ICD-10-CM | POA: Diagnosis not present

## 2019-10-19 DIAGNOSIS — F411 Generalized anxiety disorder: Secondary | ICD-10-CM | POA: Diagnosis not present

## 2019-12-02 DIAGNOSIS — Z79899 Other long term (current) drug therapy: Secondary | ICD-10-CM | POA: Diagnosis not present

## 2019-12-02 DIAGNOSIS — Z6825 Body mass index (BMI) 25.0-25.9, adult: Secondary | ICD-10-CM | POA: Diagnosis not present

## 2019-12-02 DIAGNOSIS — M6240 Contracture of muscle, unspecified site: Secondary | ICD-10-CM | POA: Diagnosis not present

## 2019-12-02 DIAGNOSIS — G20 Parkinson's disease: Secondary | ICD-10-CM | POA: Diagnosis not present

## 2019-12-02 DIAGNOSIS — K5909 Other constipation: Secondary | ICD-10-CM | POA: Diagnosis not present

## 2019-12-06 DIAGNOSIS — K5900 Constipation, unspecified: Secondary | ICD-10-CM | POA: Diagnosis not present

## 2019-12-06 DIAGNOSIS — M47816 Spondylosis without myelopathy or radiculopathy, lumbar region: Secondary | ICD-10-CM | POA: Diagnosis not present

## 2019-12-06 DIAGNOSIS — K5901 Slow transit constipation: Secondary | ICD-10-CM | POA: Diagnosis not present

## 2019-12-06 DIAGNOSIS — K6289 Other specified diseases of anus and rectum: Secondary | ICD-10-CM | POA: Diagnosis not present

## 2019-12-06 DIAGNOSIS — R109 Unspecified abdominal pain: Secondary | ICD-10-CM | POA: Diagnosis not present

## 2019-12-13 ENCOUNTER — Ambulatory Visit: Payer: PPO | Admitting: Neurology

## 2020-01-31 DIAGNOSIS — F319 Bipolar disorder, unspecified: Secondary | ICD-10-CM | POA: Diagnosis not present

## 2020-01-31 DIAGNOSIS — Z79899 Other long term (current) drug therapy: Secondary | ICD-10-CM | POA: Diagnosis not present

## 2020-01-31 DIAGNOSIS — F0390 Unspecified dementia without behavioral disturbance: Secondary | ICD-10-CM | POA: Diagnosis not present

## 2020-01-31 DIAGNOSIS — F411 Generalized anxiety disorder: Secondary | ICD-10-CM | POA: Diagnosis not present

## 2020-01-31 DIAGNOSIS — G20 Parkinson's disease: Secondary | ICD-10-CM | POA: Diagnosis not present

## 2020-01-31 DIAGNOSIS — E785 Hyperlipidemia, unspecified: Secondary | ICD-10-CM | POA: Diagnosis not present

## 2020-01-31 DIAGNOSIS — Z9181 History of falling: Secondary | ICD-10-CM | POA: Diagnosis not present

## 2020-01-31 DIAGNOSIS — Z6825 Body mass index (BMI) 25.0-25.9, adult: Secondary | ICD-10-CM | POA: Diagnosis not present

## 2020-01-31 DIAGNOSIS — Z139 Encounter for screening, unspecified: Secondary | ICD-10-CM | POA: Diagnosis not present

## 2020-07-17 IMAGING — NM NM DATSCAN
4 series · 33 of 40 positions shown · non-contrast
Comparison: None.

CLINICAL DATA: 68-year-old male with tremors and balance issues.

EXAM:
NUCLEAR MEDICINE BRAIN IMAGING WITH SPECT  (DaTscan )
TECHNIQUE: SPECT images of the brain were obtained after intravenous injection
of radiopharmaceutical. 4 hour post injection imaging. Appropriate
positioning. 0.8 ml lugols solution administered in a.m
RADIOPHARMACEUTICALS:  5.0 millicuries I 123 Ioflupane

[Series 1: spect - (id) _(id)_cor · 4.1mm · 4.14mm/px · 5 of 128 frames shown]
[frame 11/128]
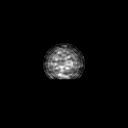
[frame 32/128]
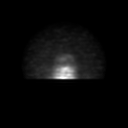
[frame 75/128]
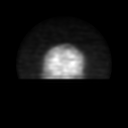
[frame 96/128]
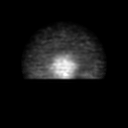
[frame 118/128]
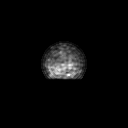

[Series 1: spect - (id) _(id)_tra · 4.1mm · 4.14mm/px · 5 of 128 frames shown]
[frame 11/128]
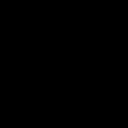
[frame 32/128]
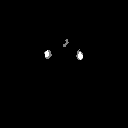
[frame 75/128]
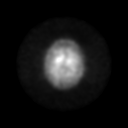
[frame 96/128]
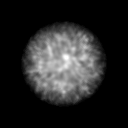
[frame 118/128]
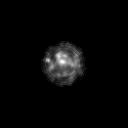

[Series 2: brain spect · 4.14mm/px · 5 of 120 frames shown]
[frame 11/120  full-range]
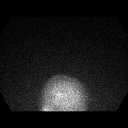
[frame 31/120  full-range]
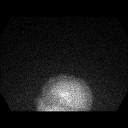
[frame 71/120  full-range]
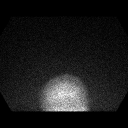
[frame 91/120  full-range]
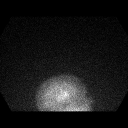
[frame 111/120  full-range]
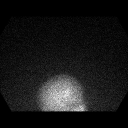

[Series 1013: mpr (id) range · 0.73mm/px · 18 of 44 slices shown]
[im 1/44]
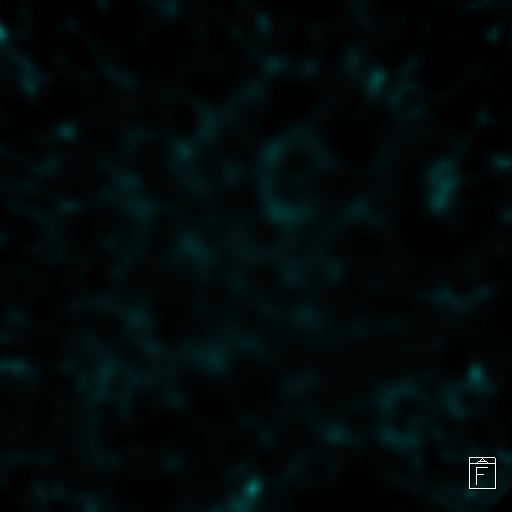
[im 5/44]
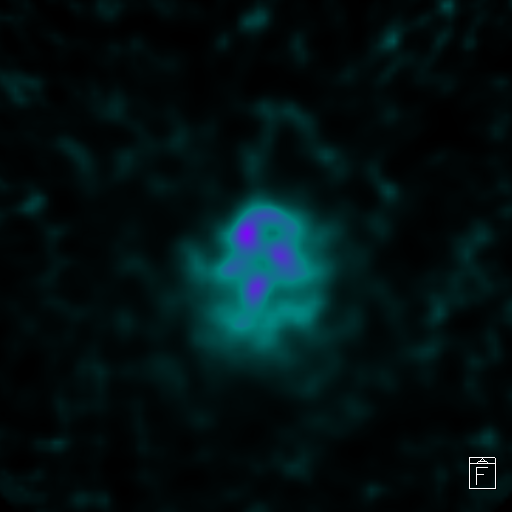
[im 7/44]
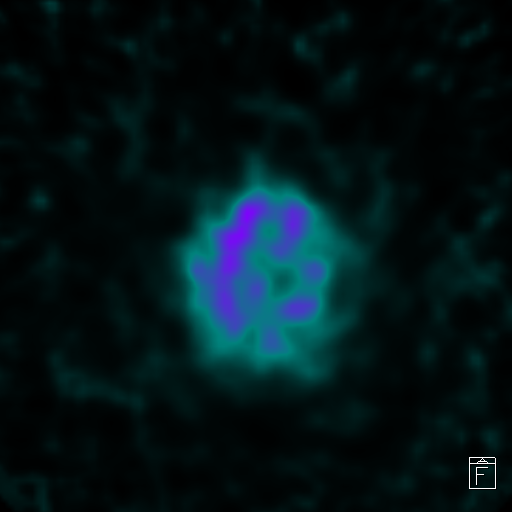
[im 9/44]
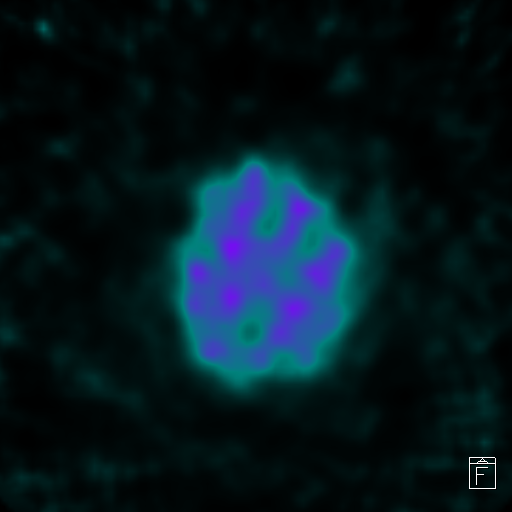
[im 11/44]
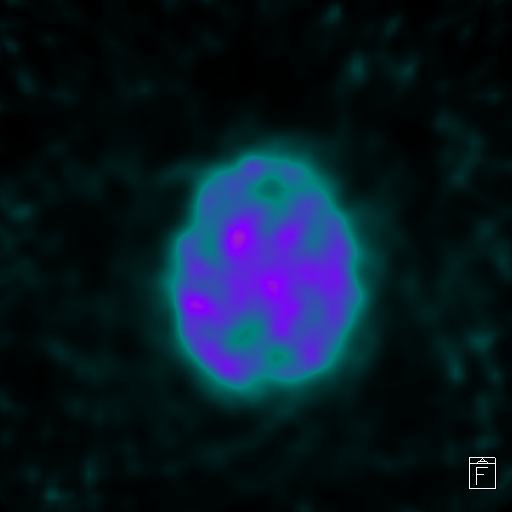
[im 13/44]
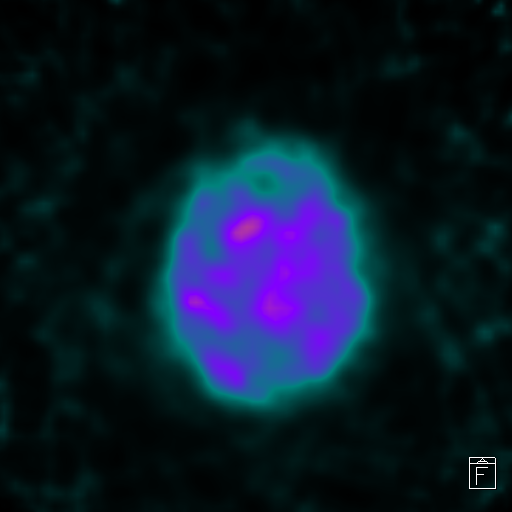
[im 17/44]
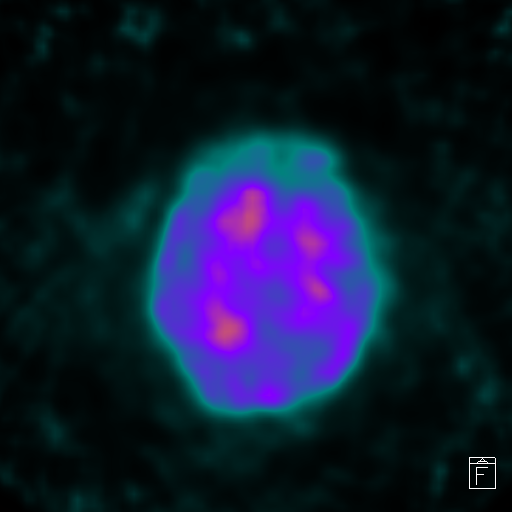
[im 19/44]
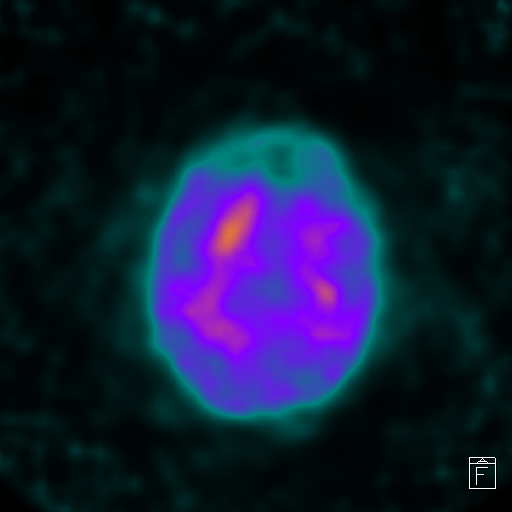
[im 21/44]
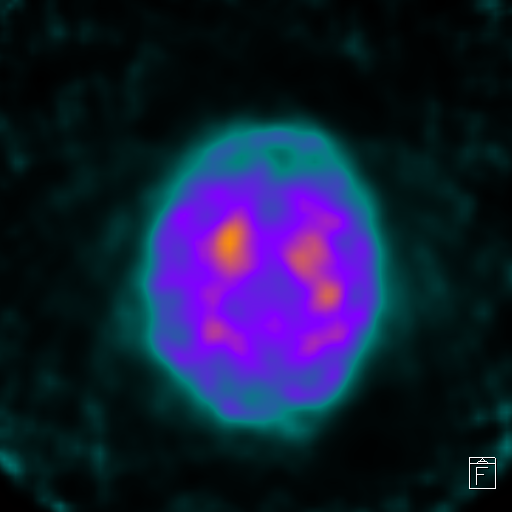
[im 23/44]
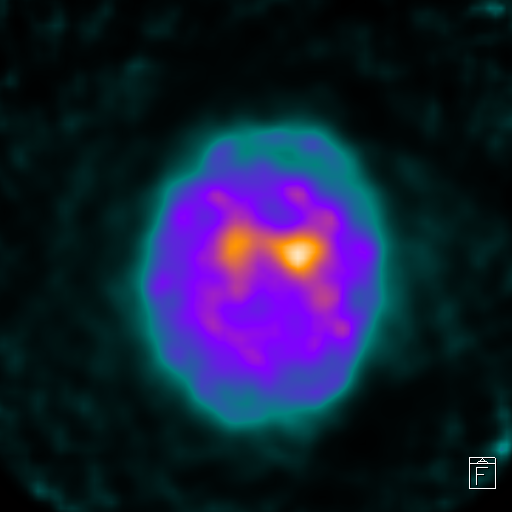
[im 25/44]
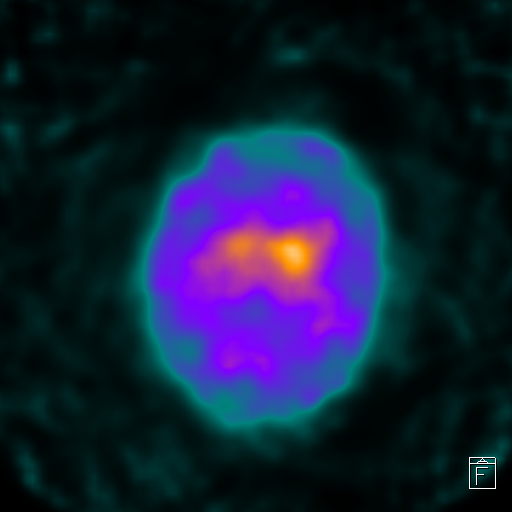
[im 29/44]
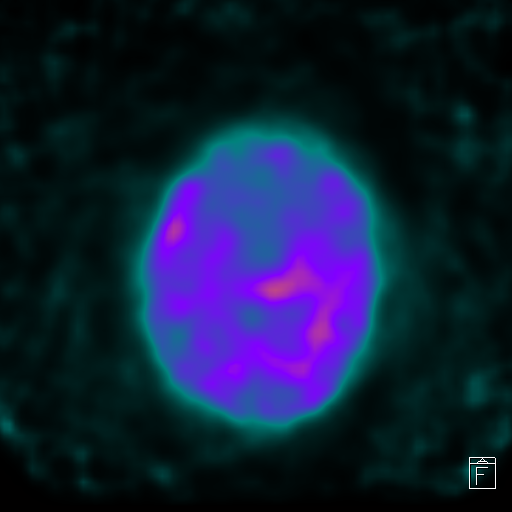
[im 31/44]
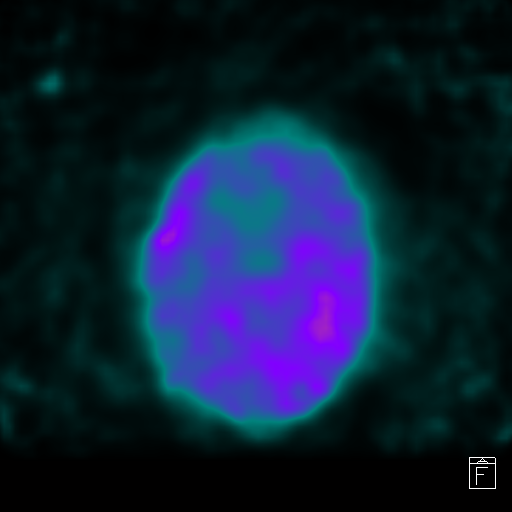
[im 33/44]
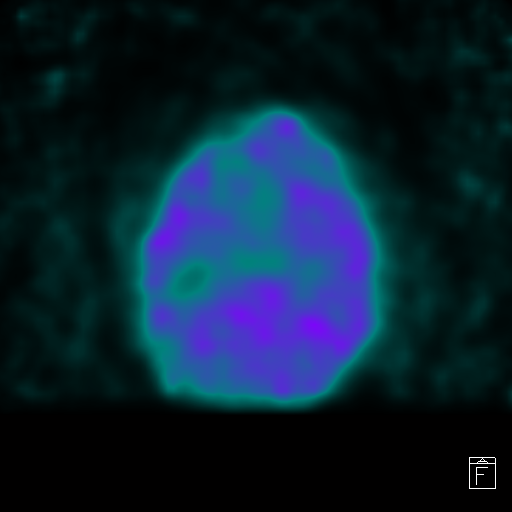
[im 35/44]
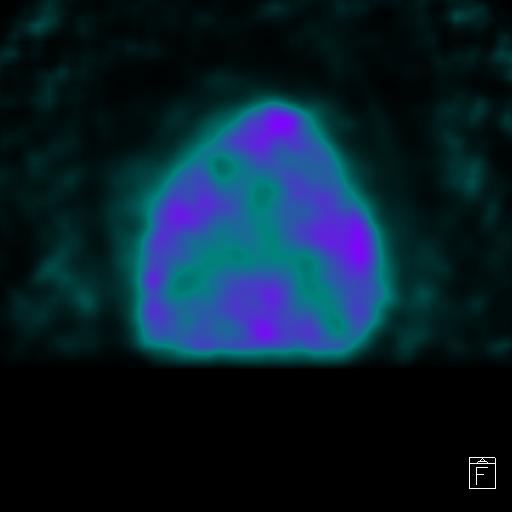
[im 37/44]
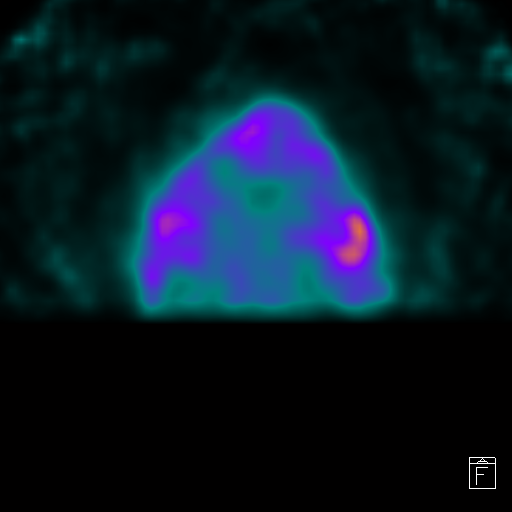
[im 41/44]
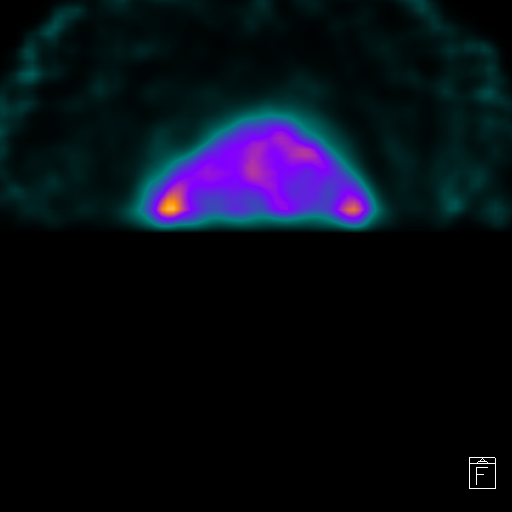
[im 44/44]
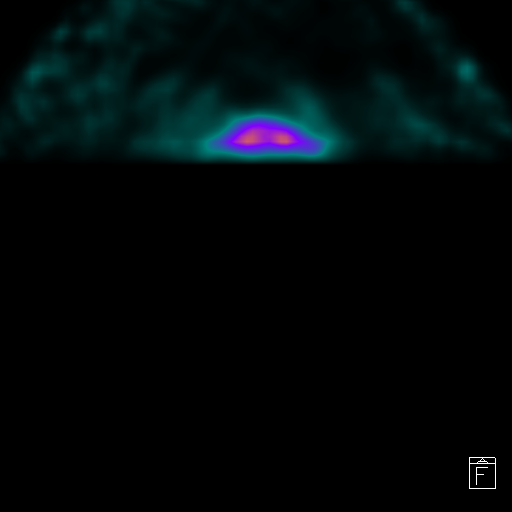

[33 of 40 positions shown; findings below may reference images not displayed]

FINDINGS: Decrease Ioflupane activity within the posterior LEFT and RIGHT
striatum. Additionally there is decreased activity in the head of
the RIGHT caudate nucleus compared to the LEFT.
IMPRESSION: Asymmetric decreased striatal Ioflupane activity as above. This
pattern can be seen in Parkinson's syndromes.

Of note, DaTSCAN is not diagnostic of Parkinson's syndromes. DaTscan
is an adjuvant test to aid in the clinical diagnosis of Parkinson's
syndrome.

## 2020-10-11 DIAGNOSIS — Z7689 Persons encountering health services in other specified circumstances: Secondary | ICD-10-CM | POA: Diagnosis not present

## 2020-10-11 DIAGNOSIS — Z8659 Personal history of other mental and behavioral disorders: Secondary | ICD-10-CM | POA: Diagnosis not present

## 2020-10-11 DIAGNOSIS — M545 Low back pain, unspecified: Secondary | ICD-10-CM | POA: Diagnosis not present

## 2020-10-11 DIAGNOSIS — Z6829 Body mass index (BMI) 29.0-29.9, adult: Secondary | ICD-10-CM | POA: Diagnosis not present

## 2020-11-06 DIAGNOSIS — E785 Hyperlipidemia, unspecified: Secondary | ICD-10-CM | POA: Diagnosis not present

## 2020-11-06 DIAGNOSIS — R413 Other amnesia: Secondary | ICD-10-CM | POA: Diagnosis not present

## 2020-11-06 DIAGNOSIS — Z1331 Encounter for screening for depression: Secondary | ICD-10-CM | POA: Diagnosis not present

## 2020-11-06 DIAGNOSIS — G2401 Drug induced subacute dyskinesia: Secondary | ICD-10-CM | POA: Diagnosis not present

## 2020-12-06 DIAGNOSIS — Z23 Encounter for immunization: Secondary | ICD-10-CM | POA: Diagnosis not present

## 2020-12-06 DIAGNOSIS — Z125 Encounter for screening for malignant neoplasm of prostate: Secondary | ICD-10-CM | POA: Diagnosis not present

## 2020-12-06 DIAGNOSIS — Z79899 Other long term (current) drug therapy: Secondary | ICD-10-CM | POA: Diagnosis not present

## 2020-12-06 DIAGNOSIS — G2401 Drug induced subacute dyskinesia: Secondary | ICD-10-CM | POA: Diagnosis not present

## 2020-12-06 DIAGNOSIS — E785 Hyperlipidemia, unspecified: Secondary | ICD-10-CM | POA: Diagnosis not present

## 2020-12-06 DIAGNOSIS — Z0000 Encounter for general adult medical examination without abnormal findings: Secondary | ICD-10-CM | POA: Diagnosis not present

## 2020-12-06 DIAGNOSIS — Z6830 Body mass index (BMI) 30.0-30.9, adult: Secondary | ICD-10-CM | POA: Diagnosis not present

## 2020-12-06 DIAGNOSIS — Z131 Encounter for screening for diabetes mellitus: Secondary | ICD-10-CM | POA: Diagnosis not present

## 2021-01-17 DIAGNOSIS — Z6831 Body mass index (BMI) 31.0-31.9, adult: Secondary | ICD-10-CM | POA: Diagnosis not present

## 2021-01-17 DIAGNOSIS — G2401 Drug induced subacute dyskinesia: Secondary | ICD-10-CM | POA: Diagnosis not present

## 2021-01-17 DIAGNOSIS — M545 Low back pain, unspecified: Secondary | ICD-10-CM | POA: Diagnosis not present

## 2021-01-17 DIAGNOSIS — R030 Elevated blood-pressure reading, without diagnosis of hypertension: Secondary | ICD-10-CM | POA: Diagnosis not present

## 2021-04-12 DIAGNOSIS — M545 Low back pain, unspecified: Secondary | ICD-10-CM | POA: Diagnosis not present

## 2021-04-25 DIAGNOSIS — G2401 Drug induced subacute dyskinesia: Secondary | ICD-10-CM | POA: Diagnosis not present

## 2021-04-25 DIAGNOSIS — K5903 Drug induced constipation: Secondary | ICD-10-CM | POA: Diagnosis not present

## 2021-04-25 DIAGNOSIS — T402X5A Adverse effect of other opioids, initial encounter: Secondary | ICD-10-CM | POA: Diagnosis not present

## 2021-04-25 DIAGNOSIS — M545 Low back pain, unspecified: Secondary | ICD-10-CM | POA: Diagnosis not present

## 2021-06-07 DIAGNOSIS — G2401 Drug induced subacute dyskinesia: Secondary | ICD-10-CM | POA: Diagnosis not present

## 2021-06-11 ENCOUNTER — Encounter: Payer: Self-pay | Admitting: Physical Medicine & Rehabilitation

## 2021-06-25 DIAGNOSIS — H25813 Combined forms of age-related cataract, bilateral: Secondary | ICD-10-CM | POA: Diagnosis not present

## 2021-06-25 DIAGNOSIS — H524 Presbyopia: Secondary | ICD-10-CM | POA: Diagnosis not present

## 2021-07-26 ENCOUNTER — Encounter: Payer: Medicare Other | Admitting: Physical Medicine & Rehabilitation

## 2021-09-24 DIAGNOSIS — M545 Low back pain, unspecified: Secondary | ICD-10-CM | POA: Diagnosis not present

## 2021-09-24 DIAGNOSIS — R413 Other amnesia: Secondary | ICD-10-CM | POA: Diagnosis not present

## 2021-09-24 DIAGNOSIS — G2401 Drug induced subacute dyskinesia: Secondary | ICD-10-CM | POA: Diagnosis not present

## 2021-09-24 DIAGNOSIS — I10 Essential (primary) hypertension: Secondary | ICD-10-CM | POA: Diagnosis not present

## 2021-09-24 DIAGNOSIS — E785 Hyperlipidemia, unspecified: Secondary | ICD-10-CM | POA: Diagnosis not present

## 2021-10-16 DIAGNOSIS — Z79899 Other long term (current) drug therapy: Secondary | ICD-10-CM | POA: Diagnosis not present

## 2021-10-16 DIAGNOSIS — M549 Dorsalgia, unspecified: Secondary | ICD-10-CM | POA: Diagnosis not present

## 2021-10-16 DIAGNOSIS — Z1389 Encounter for screening for other disorder: Secondary | ICD-10-CM | POA: Diagnosis not present

## 2021-10-16 DIAGNOSIS — G894 Chronic pain syndrome: Secondary | ICD-10-CM | POA: Diagnosis not present

## 2021-11-12 DIAGNOSIS — M549 Dorsalgia, unspecified: Secondary | ICD-10-CM | POA: Diagnosis not present

## 2021-11-12 DIAGNOSIS — G894 Chronic pain syndrome: Secondary | ICD-10-CM | POA: Diagnosis not present

## 2021-11-12 DIAGNOSIS — Z79899 Other long term (current) drug therapy: Secondary | ICD-10-CM | POA: Diagnosis not present

## 2021-11-22 DIAGNOSIS — D485 Neoplasm of uncertain behavior of skin: Secondary | ICD-10-CM | POA: Diagnosis not present

## 2021-11-22 DIAGNOSIS — D225 Melanocytic nevi of trunk: Secondary | ICD-10-CM | POA: Diagnosis not present

## 2021-11-22 DIAGNOSIS — L820 Inflamed seborrheic keratosis: Secondary | ICD-10-CM | POA: Diagnosis not present

## 2021-12-10 DIAGNOSIS — Z79899 Other long term (current) drug therapy: Secondary | ICD-10-CM | POA: Diagnosis not present

## 2021-12-10 DIAGNOSIS — E611 Iron deficiency: Secondary | ICD-10-CM | POA: Diagnosis not present

## 2021-12-10 DIAGNOSIS — Z0000 Encounter for general adult medical examination without abnormal findings: Secondary | ICD-10-CM | POA: Diagnosis not present

## 2021-12-10 DIAGNOSIS — G2401 Drug induced subacute dyskinesia: Secondary | ICD-10-CM | POA: Diagnosis not present

## 2021-12-10 DIAGNOSIS — M545 Low back pain, unspecified: Secondary | ICD-10-CM | POA: Diagnosis not present

## 2021-12-10 DIAGNOSIS — E538 Deficiency of other specified B group vitamins: Secondary | ICD-10-CM | POA: Diagnosis not present

## 2021-12-10 DIAGNOSIS — E559 Vitamin D deficiency, unspecified: Secondary | ICD-10-CM | POA: Diagnosis not present

## 2021-12-11 DIAGNOSIS — Z79899 Other long term (current) drug therapy: Secondary | ICD-10-CM | POA: Diagnosis not present

## 2021-12-11 DIAGNOSIS — M47819 Spondylosis without myelopathy or radiculopathy, site unspecified: Secondary | ICD-10-CM | POA: Diagnosis not present

## 2021-12-11 DIAGNOSIS — G894 Chronic pain syndrome: Secondary | ICD-10-CM | POA: Diagnosis not present

## 2021-12-25 DIAGNOSIS — Z1211 Encounter for screening for malignant neoplasm of colon: Secondary | ICD-10-CM | POA: Diagnosis not present

## 2021-12-25 DIAGNOSIS — Z1212 Encounter for screening for malignant neoplasm of rectum: Secondary | ICD-10-CM | POA: Diagnosis not present

## 2021-12-27 DIAGNOSIS — D485 Neoplasm of uncertain behavior of skin: Secondary | ICD-10-CM | POA: Diagnosis not present

## 2022-01-03 LAB — COLOGUARD: COLOGUARD: NEGATIVE

## 2022-01-09 DIAGNOSIS — M47819 Spondylosis without myelopathy or radiculopathy, site unspecified: Secondary | ICD-10-CM | POA: Diagnosis not present

## 2022-01-09 DIAGNOSIS — G894 Chronic pain syndrome: Secondary | ICD-10-CM | POA: Diagnosis not present

## 2022-01-09 DIAGNOSIS — M549 Dorsalgia, unspecified: Secondary | ICD-10-CM | POA: Diagnosis not present

## 2022-01-25 ENCOUNTER — Ambulatory Visit: Payer: Medicare Other | Admitting: Podiatry

## 2022-01-25 DIAGNOSIS — L603 Nail dystrophy: Secondary | ICD-10-CM | POA: Diagnosis not present

## 2022-01-25 NOTE — Progress Notes (Signed)
  Subjective:  Patient ID: Ronald Kerr, male    DOB: [birth year],  MRN: [id number]  Chief Complaint  Patient presents with   Nail Problem    Left thick toe nail, great toe    71 y.o. male presents with thickness and dystrophy of the left hallux nail. Denies pain at the left hallux nail with pressure on it.  He says it has not been trimmed in many years.  Has been growing abnormally since he had trauma to the nails in the past.  He is unable to trim it at this time.  Past Medical History:  Diagnosis Date   Bipolar disorder ([place])    BPH without obstruction/lower urinary tract symptoms    Hyperlipidemia    Insomnia     No Known Allergies  ROS: Negative except as per HPI above  Objective:  General: AAO x3, NAD  Dermatological: Left hallux with significant thickening and dystrophic growth possibly related to fungal versus traumatic dystrophy.  No pain with palpation of the nail.  No erythema or edema of the left hallux.  Vascular:  Dorsalis Pedis artery and Posterior Tibial artery pedal pulses are 2/4 bilateral.  Capillary fill time < 3 sec to all digits.   Neruologic: Grossly intact via light touch bilateral. Protective threshold intact to all sites bilateral.   Musculoskeletal: No gross boney pedal deformities bilateral. No pain, crepitus, or limitation noted with foot and ankle range of motion bilateral. Muscular strength 5/5 in all groups tested bilateral.  Gait: Unassisted, Nonantalgic.   No images are attached to the encounter.   Assessment:   1. Nail dystrophy   2. Onychodystrophy      Plan:  Patient was evaluated and treated and all questions answered.  # Nail onychodystrophy related to fungal infection versus traumatic injury in the past isolated to the left hallux nail -Discussed with the patient that the option would be for trimming of the nail and smoothing it down to regular shape and thickness. -Other alternative option would be for removal of the hallux  nail with chemical matrixectomy to prevent regrowth if it is bothering him and he wants to have it removed permanently. -At this time patient would elect for trimming of the nail and smoothing it rather than permanent removal.  It has been many years since he had it last trimmed so we will proceed with once yearly trimming of the nail. - Debrided the left hallux nail with nail nipper and smoothed mechanically with no issues   Return in about 1 year (around 01/26/2023) for Left hallux nail dystrophy.          Everitt Amber, DPM [facility]

## 2022-02-07 DIAGNOSIS — Z79899 Other long term (current) drug therapy: Secondary | ICD-10-CM | POA: Diagnosis not present

## 2022-02-07 DIAGNOSIS — M549 Dorsalgia, unspecified: Secondary | ICD-10-CM | POA: Diagnosis not present

## 2022-02-07 DIAGNOSIS — G894 Chronic pain syndrome: Secondary | ICD-10-CM | POA: Diagnosis not present

## 2022-02-21 DIAGNOSIS — N399 Disorder of urinary system, unspecified: Secondary | ICD-10-CM | POA: Diagnosis not present

## 2022-02-22 DIAGNOSIS — N399 Disorder of urinary system, unspecified: Secondary | ICD-10-CM | POA: Diagnosis not present

## 2022-02-22 DIAGNOSIS — R39198 Other difficulties with micturition: Secondary | ICD-10-CM | POA: Diagnosis not present

## 2022-02-22 DIAGNOSIS — N200 Calculus of kidney: Secondary | ICD-10-CM | POA: Diagnosis not present

## 2022-02-27 DIAGNOSIS — R42 Dizziness and giddiness: Secondary | ICD-10-CM | POA: Diagnosis not present

## 2022-02-27 DIAGNOSIS — R519 Headache, unspecified: Secondary | ICD-10-CM | POA: Diagnosis not present

## 2022-02-27 DIAGNOSIS — I10 Essential (primary) hypertension: Secondary | ICD-10-CM | POA: Diagnosis not present

## 2022-02-27 DIAGNOSIS — E7800 Pure hypercholesterolemia, unspecified: Secondary | ICD-10-CM | POA: Diagnosis not present

## 2022-02-27 DIAGNOSIS — R9431 Abnormal electrocardiogram [ECG] [EKG]: Secondary | ICD-10-CM | POA: Diagnosis not present

## 2022-02-27 DIAGNOSIS — R030 Elevated blood-pressure reading, without diagnosis of hypertension: Secondary | ICD-10-CM | POA: Diagnosis not present

## 2022-03-08 ENCOUNTER — Telehealth: Payer: Self-pay

## 2022-03-08 NOTE — Telephone Encounter (Signed)
        Patient  visited [place] on 1/10    Telephone encounter attempt :  1st  No contact information    [place], [facility]  [phone] [address], [place],  [address] Phone: [phone] Email: [email or web address]

## 2022-03-10 DIAGNOSIS — E7800 Pure hypercholesterolemia, unspecified: Secondary | ICD-10-CM | POA: Diagnosis not present

## 2022-03-10 DIAGNOSIS — R339 Retention of urine, unspecified: Secondary | ICD-10-CM | POA: Diagnosis not present

## 2022-03-10 DIAGNOSIS — R338 Other retention of urine: Secondary | ICD-10-CM | POA: Diagnosis not present

## 2022-03-10 DIAGNOSIS — K6289 Other specified diseases of anus and rectum: Secondary | ICD-10-CM | POA: Diagnosis not present

## 2022-03-10 DIAGNOSIS — K6389 Other specified diseases of intestine: Secondary | ICD-10-CM | POA: Diagnosis not present

## 2022-03-10 DIAGNOSIS — I10 Essential (primary) hypertension: Secondary | ICD-10-CM | POA: Diagnosis not present

## 2022-03-10 DIAGNOSIS — N200 Calculus of kidney: Secondary | ICD-10-CM | POA: Diagnosis not present

## 2022-03-11 ENCOUNTER — Telehealth: Payer: Self-pay

## 2022-03-11 NOTE — Telephone Encounter (Signed)
        Patient  visited [place] on 1/10    Telephone encounter attempt : 2nd   Missing or Invalid Number     [place], [place]  [phone] [address], [place], [place] [address] Phone: [phone] Email: [email or web address]

## 2022-03-12 DIAGNOSIS — M549 Dorsalgia, unspecified: Secondary | ICD-10-CM | POA: Diagnosis not present

## 2022-03-12 DIAGNOSIS — Z79899 Other long term (current) drug therapy: Secondary | ICD-10-CM | POA: Diagnosis not present

## 2022-03-12 DIAGNOSIS — M47819 Spondylosis without myelopathy or radiculopathy, site unspecified: Secondary | ICD-10-CM | POA: Diagnosis not present

## 2022-03-12 DIAGNOSIS — G894 Chronic pain syndrome: Secondary | ICD-10-CM | POA: Diagnosis not present

## 2022-03-13 DIAGNOSIS — N138 Other obstructive and reflux uropathy: Secondary | ICD-10-CM | POA: Diagnosis not present

## 2022-03-13 DIAGNOSIS — N200 Calculus of kidney: Secondary | ICD-10-CM | POA: Diagnosis not present

## 2022-03-13 DIAGNOSIS — I10 Essential (primary) hypertension: Secondary | ICD-10-CM | POA: Diagnosis not present

## 2022-03-27 DIAGNOSIS — Z79899 Other long term (current) drug therapy: Secondary | ICD-10-CM | POA: Diagnosis not present

## 2022-03-27 DIAGNOSIS — K5909 Other constipation: Secondary | ICD-10-CM | POA: Diagnosis not present

## 2022-04-10 DIAGNOSIS — M549 Dorsalgia, unspecified: Secondary | ICD-10-CM | POA: Diagnosis not present

## 2022-04-10 DIAGNOSIS — G894 Chronic pain syndrome: Secondary | ICD-10-CM | POA: Diagnosis not present

## 2022-04-10 DIAGNOSIS — Z79891 Long term (current) use of opiate analgesic: Secondary | ICD-10-CM | POA: Diagnosis not present

## 2022-04-23 DIAGNOSIS — M5126 Other intervertebral disc displacement, lumbar region: Secondary | ICD-10-CM | POA: Diagnosis not present

## 2022-04-23 DIAGNOSIS — M4726 Other spondylosis with radiculopathy, lumbar region: Secondary | ICD-10-CM | POA: Diagnosis not present

## 2022-04-23 DIAGNOSIS — M5136 Other intervertebral disc degeneration, lumbar region: Secondary | ICD-10-CM | POA: Diagnosis not present

## 2022-05-07 DIAGNOSIS — K5909 Other constipation: Secondary | ICD-10-CM | POA: Diagnosis not present

## 2022-05-08 DIAGNOSIS — Z79899 Other long term (current) drug therapy: Secondary | ICD-10-CM | POA: Diagnosis not present

## 2022-05-08 DIAGNOSIS — Z1389 Encounter for screening for other disorder: Secondary | ICD-10-CM | POA: Diagnosis not present

## 2022-05-08 DIAGNOSIS — G894 Chronic pain syndrome: Secondary | ICD-10-CM | POA: Diagnosis not present

## 2022-05-08 DIAGNOSIS — M549 Dorsalgia, unspecified: Secondary | ICD-10-CM | POA: Diagnosis not present

## 2022-06-05 DIAGNOSIS — M47819 Spondylosis without myelopathy or radiculopathy, site unspecified: Secondary | ICD-10-CM | POA: Diagnosis not present

## 2022-06-05 DIAGNOSIS — M5136 Other intervertebral disc degeneration, lumbar region: Secondary | ICD-10-CM | POA: Diagnosis not present

## 2022-06-05 DIAGNOSIS — Z1389 Encounter for screening for other disorder: Secondary | ICD-10-CM | POA: Diagnosis not present

## 2022-06-05 DIAGNOSIS — M549 Dorsalgia, unspecified: Secondary | ICD-10-CM | POA: Diagnosis not present

## 2022-06-05 DIAGNOSIS — G894 Chronic pain syndrome: Secondary | ICD-10-CM | POA: Diagnosis not present

## 2022-06-18 DIAGNOSIS — R001 Bradycardia, unspecified: Secondary | ICD-10-CM | POA: Diagnosis not present

## 2022-06-18 DIAGNOSIS — M47816 Spondylosis without myelopathy or radiculopathy, lumbar region: Secondary | ICD-10-CM | POA: Diagnosis not present

## 2022-06-18 DIAGNOSIS — E876 Hypokalemia: Secondary | ICD-10-CM | POA: Diagnosis not present

## 2022-06-18 DIAGNOSIS — I10 Essential (primary) hypertension: Secondary | ICD-10-CM | POA: Diagnosis not present

## 2022-06-21 DIAGNOSIS — R001 Bradycardia, unspecified: Secondary | ICD-10-CM | POA: Diagnosis not present

## 2022-06-22 DIAGNOSIS — N3000 Acute cystitis without hematuria: Secondary | ICD-10-CM | POA: Diagnosis not present

## 2022-07-04 DIAGNOSIS — Z1389 Encounter for screening for other disorder: Secondary | ICD-10-CM | POA: Diagnosis not present

## 2022-07-04 DIAGNOSIS — M5136 Other intervertebral disc degeneration, lumbar region: Secondary | ICD-10-CM | POA: Diagnosis not present

## 2022-07-04 DIAGNOSIS — M47819 Spondylosis without myelopathy or radiculopathy, site unspecified: Secondary | ICD-10-CM | POA: Diagnosis not present

## 2022-07-04 DIAGNOSIS — G894 Chronic pain syndrome: Secondary | ICD-10-CM | POA: Diagnosis not present

## 2022-07-04 DIAGNOSIS — M549 Dorsalgia, unspecified: Secondary | ICD-10-CM | POA: Diagnosis not present

## 2022-07-08 DIAGNOSIS — R002 Palpitations: Secondary | ICD-10-CM | POA: Diagnosis not present

## 2022-07-19 ENCOUNTER — Other Ambulatory Visit: Payer: Self-pay

## 2022-07-19 DIAGNOSIS — E876 Hypokalemia: Secondary | ICD-10-CM | POA: Insufficient documentation

## 2022-07-19 DIAGNOSIS — E785 Hyperlipidemia, unspecified: Secondary | ICD-10-CM | POA: Insufficient documentation

## 2022-07-19 DIAGNOSIS — G4700 Insomnia, unspecified: Secondary | ICD-10-CM | POA: Insufficient documentation

## 2022-07-19 DIAGNOSIS — Z6829 Body mass index (BMI) 29.0-29.9, adult: Secondary | ICD-10-CM | POA: Insufficient documentation

## 2022-07-19 DIAGNOSIS — R001 Bradycardia, unspecified: Secondary | ICD-10-CM | POA: Insufficient documentation

## 2022-07-19 DIAGNOSIS — F319 Bipolar disorder, unspecified: Secondary | ICD-10-CM | POA: Insufficient documentation

## 2022-07-19 DIAGNOSIS — I10 Essential (primary) hypertension: Secondary | ICD-10-CM | POA: Insufficient documentation

## 2022-07-19 DIAGNOSIS — N400 Enlarged prostate without lower urinary tract symptoms: Secondary | ICD-10-CM | POA: Insufficient documentation

## 2022-07-24 DIAGNOSIS — I10 Essential (primary) hypertension: Secondary | ICD-10-CM | POA: Diagnosis not present

## 2022-07-24 DIAGNOSIS — R339 Retention of urine, unspecified: Secondary | ICD-10-CM | POA: Diagnosis not present

## 2022-07-24 DIAGNOSIS — K3189 Other diseases of stomach and duodenum: Secondary | ICD-10-CM | POA: Diagnosis not present

## 2022-07-24 DIAGNOSIS — N200 Calculus of kidney: Secondary | ICD-10-CM | POA: Diagnosis not present

## 2022-07-24 DIAGNOSIS — R338 Other retention of urine: Secondary | ICD-10-CM | POA: Diagnosis not present

## 2022-07-24 DIAGNOSIS — Z79899 Other long term (current) drug therapy: Secondary | ICD-10-CM | POA: Diagnosis not present

## 2022-07-24 DIAGNOSIS — Z79891 Long term (current) use of opiate analgesic: Secondary | ICD-10-CM | POA: Diagnosis not present

## 2022-07-25 DIAGNOSIS — Z1211 Encounter for screening for malignant neoplasm of colon: Secondary | ICD-10-CM | POA: Diagnosis not present

## 2022-07-25 DIAGNOSIS — I10 Essential (primary) hypertension: Secondary | ICD-10-CM | POA: Diagnosis not present

## 2022-07-25 DIAGNOSIS — G2401 Drug induced subacute dyskinesia: Secondary | ICD-10-CM | POA: Diagnosis not present

## 2022-07-26 DIAGNOSIS — K5909 Other constipation: Secondary | ICD-10-CM | POA: Diagnosis not present

## 2022-07-26 DIAGNOSIS — R339 Retention of urine, unspecified: Secondary | ICD-10-CM | POA: Diagnosis not present

## 2022-07-29 ENCOUNTER — Ambulatory Visit: Payer: Medicare Other | Admitting: Podiatry

## 2022-07-30 DIAGNOSIS — M47816 Spondylosis without myelopathy or radiculopathy, lumbar region: Secondary | ICD-10-CM | POA: Diagnosis not present

## 2022-08-01 DIAGNOSIS — M47819 Spondylosis without myelopathy or radiculopathy, site unspecified: Secondary | ICD-10-CM | POA: Diagnosis not present

## 2022-08-01 DIAGNOSIS — M5136 Other intervertebral disc degeneration, lumbar region: Secondary | ICD-10-CM | POA: Diagnosis not present

## 2022-08-01 DIAGNOSIS — G894 Chronic pain syndrome: Secondary | ICD-10-CM | POA: Diagnosis not present

## 2022-08-01 DIAGNOSIS — M549 Dorsalgia, unspecified: Secondary | ICD-10-CM | POA: Diagnosis not present

## 2022-08-01 DIAGNOSIS — Z1389 Encounter for screening for other disorder: Secondary | ICD-10-CM | POA: Diagnosis not present

## 2022-08-29 ENCOUNTER — Telehealth: Payer: Self-pay

## 2022-08-29 ENCOUNTER — Ambulatory Visit: Payer: Medicare Other | Attending: Cardiology | Admitting: Cardiology

## 2022-08-29 ENCOUNTER — Encounter: Payer: Self-pay | Admitting: Cardiology

## 2022-08-29 VITALS — BP 114/70 | HR 69 | Ht 73.0 in | Wt 199.0 lb

## 2022-08-29 DIAGNOSIS — R001 Bradycardia, unspecified: Secondary | ICD-10-CM | POA: Diagnosis not present

## 2022-08-29 DIAGNOSIS — I10 Essential (primary) hypertension: Secondary | ICD-10-CM

## 2022-08-29 DIAGNOSIS — G259 Extrapyramidal and movement disorder, unspecified: Secondary | ICD-10-CM | POA: Insufficient documentation

## 2022-08-29 DIAGNOSIS — E782 Mixed hyperlipidemia: Secondary | ICD-10-CM

## 2022-08-29 DIAGNOSIS — F0390 Unspecified dementia without behavioral disturbance: Secondary | ICD-10-CM | POA: Insufficient documentation

## 2022-08-29 NOTE — Progress Notes (Signed)
Cardiology Office Note:    Date:  08/29/2022   ID:  Ronald Kerr, DOB [birth year], MRN [id number]  PCP:  Buckner Malta, MD  Cardiologist:  Garwin Brothers, MD   Referring MD: Buckner Malta, MD    ASSESSMENT:    1. Essential hypertension   2. Sinus bradycardia   3. Mixed hyperlipidemia    PLAN:    In order of problems listed above:  Primary prevention stressed with the patient.  Importance of compliance with diet medication stressed and patient verbalized standing. Essential hypertension: Blood pressure stable and diet was emphasized.  Lifestyle modification urged.  With diet control he seems to have done well with blood pressure and it is stable.  She will monitor it for him. Mixed dyslipidemia: Lab work from past has revealed significantly elevated lipids.  Diet emphasized.  This will be followed by primary care.  Guideline directed therapy recommended. Dizzy spells: Monitoring done and we are awaiting those records.  No syncope history. Cardiac murmur: Echocardiogram will be done to assess murmur heard on auscultation. Patient will be seen in follow-up appointment in 6 months or earlier if the patient has any concerns.    Medication Adjustments/Labs and Tests Ordered: Current medicines are reviewed at length with the patient today.  Concerns regarding medicines are outlined above.  Orders Placed This Encounter  Procedures   EKG 12-Lead   No orders of the defined types were placed in this encounter.    History of Present Illness:    Ronald Kerr is a 72 y.o. male who is being seen today for the evaluation of essential hypertension and dizzy spells at the request of Buckner Malta, MD. patient is a pleasant 72 year old male.  He has past medical history of mixed dyslipidemia.  Recently his wife mentions to me that his blood pressure has been shooting up.  He was in the emergency room.  He has urinary bladder problems and movement disorder.  Subsequently  follow-ups have revealed that his blood pressure has been stable.  For this reason the appointment was made.  He also mentions that the monitoring was done for 2 weeks.  I will try to obtain those reports from [place] website but they are unavailable.  Will try to call his doctor to get those reports.  He was mentioned to have an irregular heart rate.  I would like to look at the strips before I make up my mind about this findings.  Past Medical History:  Diagnosis Date   Bipolar disorder ([facility])    BMI 29.0-29.9,adult    BPH without obstruction/lower urinary tract symptoms    Calculus of gallbladder with chronic cholecystitis without obstruction 05/16/2016   Essential hypertension    Hyperlipidemia    Hypokalemia    Insomnia    Sinus bradycardia     Past Surgical History:  Procedure Laterality Date   APPENDECTOMY     CHOLECYSTECTOMY      Current Medications: Current Meds  Medication Sig   b complex vitamins capsule Take 1 capsule by mouth daily.   bethanechol (URECHOLINE) 25 MG tablet Take 25 mg by mouth daily.   cholecalciferol (VITAMIN D3) 25 MCG (1000 UNIT) tablet Take 1,000 Units by mouth daily.   memantine (NAMENDA) 10 MG tablet Take 10 mg by mouth 2 (two) times daily.   morphine (MSIR) 15 MG tablet Take 15 mg by mouth 3 (three) times daily as needed for severe pain.   MOVANTIK 25 MG TABS tablet Take 25 mg by mouth daily.  Multiple Vitamins-Minerals (CENTRUM SILVER PO) Take 1 tablet by mouth daily.   polyethylene glycol (MIRALAX / GLYCOLAX) 17 g packet Take 17 g by mouth daily.   tadalafil (CIALIS) 5 MG tablet Take 5 mg by mouth daily.   tamsulosin (FLOMAX) 0.4 MG CAPS capsule Take 0.4 mg by mouth daily.   Wheat Dextrin (BENEFIBER DRINK MIX PO) Take 1 Application by mouth daily.     Allergies:   Patient has no known allergies.   Social History   Socioeconomic History   Marital status: Married    Spouse name: Not on file   Number of children: Not on file   Years of  education: Not on file   Highest education level: Not on file  Occupational History   Not on file  Tobacco Use   Smoking status: Never   Smokeless tobacco: Never  Vaping Use   Vaping status: Never Used  Substance and Sexual Activity   Alcohol use: Yes    Alcohol/week: 0.0 standard drinks of alcohol    Comment: occas.   Drug use: No   Sexual activity: Not on file  Other Topics Concern   Not on file  Social History Narrative   Not on file   Social Determinants of Health   Financial Resource Strain: Not on file  Food Insecurity: Not on file  Transportation Needs: Not on file  Physical Activity: Not on file  Stress: Not on file  Social Connections: Not on file     Family History: The patient's family history includes Breast cancer in his mother.  ROS:   Please see the history of present illness.    All other systems reviewed and are negative.  EKGs/Labs/Other Studies Reviewed:    The following studies were reviewed today: Sinus rhythm low voltage and nonspecific ST-T changes       Recent Labs: No results found for requested labs within last 365 days.  Recent Lipid Panel No results found for: "CHOL", "TRIG", "HDL", "CHOLHDL", "VLDL", "LDLCALC", "LDLDIRECT"  Physical Exam:    VS:  BP 114/70 (BP Location: Left Arm, Patient Position: Sitting, Cuff Size: Normal)   Pulse 69   Ht 6\' 1"  (1.854 m)   Wt 199 lb (90.3 kg)   SpO2 96%   BMI 26.25 kg/m     Wt Readings from Last 3 Encounters:  08/29/22 199 lb (90.3 kg)  08/10/19 211 lb (95.7 kg)  12/10/18 199 lb (90.3 kg)     GEN: Patient is in no acute distress HEENT: Normal NECK: No JVD; No carotid bruits LYMPHATICS: No lymphadenopathy CARDIAC: S1 S2 regular, 2/6 systolic murmur at the apex. RESPIRATORY:  Clear to auscultation without rales, wheezing or rhonchi  ABDOMEN: Soft, non-tender, non-distended MUSCULOSKELETAL:  No edema; No deformity  SKIN: Warm and dry NEUROLOGIC:  Alert and oriented x 3 PSYCHIATRIC:   Normal affect    Signed, Garwin Brothers, MD  08/29/2022 3:17 PM    [facility]

## 2022-08-29 NOTE — Patient Instructions (Signed)
Medication Instructions:  Your physician recommends that you continue on your current medications as directed. Please refer to the Current Medication list given to you today.  *If you need a refill on your cardiac medications before your next appointment, please call your pharmacy*   Lab Work: None If you have labs (blood work) drawn today and your tests are completely normal, you will receive your results only by: MyChart Message (if you have MyChart) OR A paper copy in the mail If you have any lab test that is abnormal or we need to change your treatment, we will call you to review the results.   Testing/Procedures: Your physician has requested that you have an echocardiogram. Echocardiography is a painless test that uses sound waves to create images of your heart. It provides your doctor with information about the size and shape of your heart and how well your heart's chambers and valves are working. This procedure takes approximately one hour. There are no restrictions for this procedure. Please do NOT wear cologne, perfume, aftershave, or lotions (deodorant is allowed). Please arrive 15 minutes prior to your appointment time.    Follow-Up: At [facility], you and your health needs are our priority.  As part of our continuing mission to provide you with exceptional heart care, we have created designated Provider Care Teams.  These Care Teams include your primary Cardiologist (physician) and Advanced Practice Providers (APPs -  Physician Assistants and Nurse Practitioners) who all work together to provide you with the care you need, when you need it.  We recommend signing up for the patient portal called "MyChart".  Sign up information is provided on this After Visit Summary.  MyChart is used to connect with patients for Virtual Visits (Telemedicine).  Patients are able to view lab/test results, encounter notes, upcoming appointments, etc.  Non-urgent messages can be sent to your  provider as well.   To learn more about what you can do with MyChart, go to [email or web address].    Your next appointment:   9 month(s)  Provider:   Belva Crome, MD    Other Instructions None

## 2022-08-29 NOTE — Telephone Encounter (Signed)
Patient stated that he had a heart monitor put on at [facility] office. Dr. Tomie China asked if we could call his PCP and get a copy of the heart monitor results. Called [facility]'s office and spoke to [place], who said she was going to fax over the report. Will continue to check Onbase for the heart monitor results.

## 2022-09-02 DIAGNOSIS — R339 Retention of urine, unspecified: Secondary | ICD-10-CM | POA: Diagnosis not present

## 2022-09-03 DIAGNOSIS — M5136 Other intervertebral disc degeneration, lumbar region: Secondary | ICD-10-CM | POA: Diagnosis not present

## 2022-09-03 DIAGNOSIS — G894 Chronic pain syndrome: Secondary | ICD-10-CM | POA: Diagnosis not present

## 2022-09-03 DIAGNOSIS — Z1389 Encounter for screening for other disorder: Secondary | ICD-10-CM | POA: Diagnosis not present

## 2022-09-03 DIAGNOSIS — G249 Dystonia, unspecified: Secondary | ICD-10-CM | POA: Diagnosis not present

## 2022-09-03 DIAGNOSIS — M47819 Spondylosis without myelopathy or radiculopathy, site unspecified: Secondary | ICD-10-CM | POA: Diagnosis not present

## 2022-09-09 DIAGNOSIS — G2401 Drug induced subacute dyskinesia: Secondary | ICD-10-CM | POA: Diagnosis not present

## 2022-09-09 DIAGNOSIS — K5903 Drug induced constipation: Secondary | ICD-10-CM | POA: Diagnosis not present

## 2022-09-09 DIAGNOSIS — R001 Bradycardia, unspecified: Secondary | ICD-10-CM | POA: Diagnosis not present

## 2022-09-09 DIAGNOSIS — I10 Essential (primary) hypertension: Secondary | ICD-10-CM | POA: Diagnosis not present

## 2022-09-09 DIAGNOSIS — T402X5A Adverse effect of other opioids, initial encounter: Secondary | ICD-10-CM | POA: Diagnosis not present

## 2022-09-13 ENCOUNTER — Telehealth: Payer: Self-pay

## 2022-09-13 MED ORDER — METOPROLOL SUCCINATE ER 25 MG PO TB24: 25.0000 mg | ORAL_TABLET | Freq: Every day | ORAL | 3 refills | Status: DC

## 2022-09-13 NOTE — Telephone Encounter (Signed)
Spoke with Ronald Kerr per DPR and advised to start Toprol XL 25 mg daily and stay hydrated. Ronald Kerr verbalized understanding and had no additional questions

## 2022-09-16 DIAGNOSIS — H25813 Combined forms of age-related cataract, bilateral: Secondary | ICD-10-CM | POA: Diagnosis not present

## 2022-09-16 DIAGNOSIS — H524 Presbyopia: Secondary | ICD-10-CM | POA: Diagnosis not present

## 2022-09-17 DIAGNOSIS — M47816 Spondylosis without myelopathy or radiculopathy, lumbar region: Secondary | ICD-10-CM | POA: Diagnosis not present

## 2022-09-24 ENCOUNTER — Ambulatory Visit: Payer: Medicare Other

## 2022-09-24 DIAGNOSIS — E782 Mixed hyperlipidemia: Secondary | ICD-10-CM

## 2022-09-24 DIAGNOSIS — I10 Essential (primary) hypertension: Secondary | ICD-10-CM

## 2022-09-24 DIAGNOSIS — R001 Bradycardia, unspecified: Secondary | ICD-10-CM | POA: Diagnosis not present

## 2022-09-24 LAB — ECHOCARDIOGRAM COMPLETE
Area-P 1/2: 3.06 cm2
S' Lateral: 3.2 cm

## 2022-09-26 ENCOUNTER — Telehealth: Payer: Self-pay | Admitting: Cardiology

## 2022-09-26 NOTE — Telephone Encounter (Signed)
Pt returning nurse's call. Please advise

## 2022-09-26 NOTE — Telephone Encounter (Signed)
No Vm set up

## 2022-09-27 NOTE — Telephone Encounter (Signed)
Wife returned staff call stating her call was accidentally dropped.

## 2022-09-27 NOTE — Telephone Encounter (Signed)
Results reviewed with pt as per Dr. Revankar's note.  Pt verbalized understanding and had no additional questions. Routed to PCP.  

## 2022-09-27 NOTE — Telephone Encounter (Signed)
Left vm to call back

## 2022-10-08 DIAGNOSIS — M47819 Spondylosis without myelopathy or radiculopathy, site unspecified: Secondary | ICD-10-CM | POA: Diagnosis not present

## 2022-10-08 DIAGNOSIS — G249 Dystonia, unspecified: Secondary | ICD-10-CM | POA: Diagnosis not present

## 2022-10-08 DIAGNOSIS — M5136 Other intervertebral disc degeneration, lumbar region: Secondary | ICD-10-CM | POA: Diagnosis not present

## 2022-10-08 DIAGNOSIS — G894 Chronic pain syndrome: Secondary | ICD-10-CM | POA: Diagnosis not present

## 2022-10-14 DIAGNOSIS — K219 Gastro-esophageal reflux disease without esophagitis: Secondary | ICD-10-CM | POA: Diagnosis not present

## 2022-10-14 DIAGNOSIS — R935 Abnormal findings on diagnostic imaging of other abdominal regions, including retroperitoneum: Secondary | ICD-10-CM | POA: Diagnosis not present

## 2022-10-14 DIAGNOSIS — K5900 Constipation, unspecified: Secondary | ICD-10-CM | POA: Diagnosis not present

## 2022-11-05 DIAGNOSIS — R339 Retention of urine, unspecified: Secondary | ICD-10-CM | POA: Diagnosis not present

## 2022-11-06 DIAGNOSIS — M549 Dorsalgia, unspecified: Secondary | ICD-10-CM | POA: Diagnosis not present

## 2022-11-06 DIAGNOSIS — G894 Chronic pain syndrome: Secondary | ICD-10-CM | POA: Diagnosis not present

## 2022-11-06 DIAGNOSIS — G249 Dystonia, unspecified: Secondary | ICD-10-CM | POA: Diagnosis not present

## 2022-11-21 DIAGNOSIS — K648 Other hemorrhoids: Secondary | ICD-10-CM | POA: Diagnosis not present

## 2022-11-21 DIAGNOSIS — G20A1 Parkinson's disease without dyskinesia, without mention of fluctuations: Secondary | ICD-10-CM | POA: Diagnosis not present

## 2022-11-21 DIAGNOSIS — Z860100 Personal history of colon polyps, unspecified: Secondary | ICD-10-CM | POA: Diagnosis not present

## 2022-11-21 DIAGNOSIS — R935 Abnormal findings on diagnostic imaging of other abdominal regions, including retroperitoneum: Secondary | ICD-10-CM | POA: Diagnosis not present

## 2022-11-21 DIAGNOSIS — Z09 Encounter for follow-up examination after completed treatment for conditions other than malignant neoplasm: Secondary | ICD-10-CM | POA: Diagnosis not present

## 2022-11-26 DIAGNOSIS — M47816 Spondylosis without myelopathy or radiculopathy, lumbar region: Secondary | ICD-10-CM | POA: Diagnosis not present

## 2022-12-11 DIAGNOSIS — G249 Dystonia, unspecified: Secondary | ICD-10-CM | POA: Diagnosis not present

## 2022-12-11 DIAGNOSIS — M25559 Pain in unspecified hip: Secondary | ICD-10-CM | POA: Diagnosis not present

## 2022-12-11 DIAGNOSIS — M549 Dorsalgia, unspecified: Secondary | ICD-10-CM | POA: Diagnosis not present

## 2022-12-11 DIAGNOSIS — G894 Chronic pain syndrome: Secondary | ICD-10-CM | POA: Diagnosis not present

## 2022-12-16 DIAGNOSIS — Z0000 Encounter for general adult medical examination without abnormal findings: Secondary | ICD-10-CM | POA: Diagnosis not present

## 2022-12-16 DIAGNOSIS — I10 Essential (primary) hypertension: Secondary | ICD-10-CM | POA: Diagnosis not present

## 2022-12-16 DIAGNOSIS — E785 Hyperlipidemia, unspecified: Secondary | ICD-10-CM | POA: Diagnosis not present

## 2022-12-16 DIAGNOSIS — G2401 Drug induced subacute dyskinesia: Secondary | ICD-10-CM | POA: Diagnosis not present

## 2022-12-16 DIAGNOSIS — Z79899 Other long term (current) drug therapy: Secondary | ICD-10-CM | POA: Diagnosis not present

## 2022-12-16 DIAGNOSIS — R413 Other amnesia: Secondary | ICD-10-CM | POA: Diagnosis not present

## 2022-12-16 DIAGNOSIS — R7302 Impaired glucose tolerance (oral): Secondary | ICD-10-CM | POA: Diagnosis not present

## 2022-12-23 ENCOUNTER — Encounter: Payer: Self-pay | Admitting: Cardiology

## 2023-01-06 DIAGNOSIS — M25551 Pain in right hip: Secondary | ICD-10-CM | POA: Diagnosis not present

## 2023-01-06 DIAGNOSIS — M25552 Pain in left hip: Secondary | ICD-10-CM | POA: Diagnosis not present

## 2023-01-08 DIAGNOSIS — R339 Retention of urine, unspecified: Secondary | ICD-10-CM | POA: Diagnosis not present

## 2023-01-13 DIAGNOSIS — M47819 Spondylosis without myelopathy or radiculopathy, site unspecified: Secondary | ICD-10-CM | POA: Diagnosis not present

## 2023-01-13 DIAGNOSIS — M549 Dorsalgia, unspecified: Secondary | ICD-10-CM | POA: Diagnosis not present

## 2023-01-13 DIAGNOSIS — G894 Chronic pain syndrome: Secondary | ICD-10-CM | POA: Diagnosis not present

## 2023-01-13 DIAGNOSIS — G249 Dystonia, unspecified: Secondary | ICD-10-CM | POA: Diagnosis not present

## 2023-01-13 DIAGNOSIS — M25559 Pain in unspecified hip: Secondary | ICD-10-CM | POA: Diagnosis not present

## 2023-02-25 DIAGNOSIS — G249 Dystonia, unspecified: Secondary | ICD-10-CM | POA: Diagnosis not present

## 2023-02-25 DIAGNOSIS — M549 Dorsalgia, unspecified: Secondary | ICD-10-CM | POA: Diagnosis not present

## 2023-02-25 DIAGNOSIS — M47819 Spondylosis without myelopathy or radiculopathy, site unspecified: Secondary | ICD-10-CM | POA: Diagnosis not present

## 2023-02-25 DIAGNOSIS — M25559 Pain in unspecified hip: Secondary | ICD-10-CM | POA: Diagnosis not present

## 2023-02-25 DIAGNOSIS — Z79899 Other long term (current) drug therapy: Secondary | ICD-10-CM | POA: Diagnosis not present

## 2023-02-25 DIAGNOSIS — Z1389 Encounter for screening for other disorder: Secondary | ICD-10-CM | POA: Diagnosis not present

## 2023-02-25 DIAGNOSIS — G894 Chronic pain syndrome: Secondary | ICD-10-CM | POA: Diagnosis not present

## 2023-03-18 DIAGNOSIS — K5903 Drug induced constipation: Secondary | ICD-10-CM | POA: Diagnosis not present

## 2023-03-18 DIAGNOSIS — G2401 Drug induced subacute dyskinesia: Secondary | ICD-10-CM | POA: Diagnosis not present

## 2023-03-18 DIAGNOSIS — I10 Essential (primary) hypertension: Secondary | ICD-10-CM | POA: Diagnosis not present

## 2023-03-18 DIAGNOSIS — R209 Unspecified disturbances of skin sensation: Secondary | ICD-10-CM | POA: Diagnosis not present

## 2023-03-18 DIAGNOSIS — T402X5A Adverse effect of other opioids, initial encounter: Secondary | ICD-10-CM | POA: Diagnosis not present

## 2023-03-26 DIAGNOSIS — G894 Chronic pain syndrome: Secondary | ICD-10-CM | POA: Diagnosis not present

## 2023-03-26 DIAGNOSIS — M25559 Pain in unspecified hip: Secondary | ICD-10-CM | POA: Diagnosis not present

## 2023-03-26 DIAGNOSIS — G249 Dystonia, unspecified: Secondary | ICD-10-CM | POA: Diagnosis not present

## 2023-03-26 DIAGNOSIS — M549 Dorsalgia, unspecified: Secondary | ICD-10-CM | POA: Diagnosis not present

## 2023-04-15 DIAGNOSIS — R2681 Unsteadiness on feet: Secondary | ICD-10-CM | POA: Diagnosis not present

## 2023-04-15 DIAGNOSIS — M47816 Spondylosis without myelopathy or radiculopathy, lumbar region: Secondary | ICD-10-CM | POA: Diagnosis not present

## 2023-04-15 DIAGNOSIS — M6281 Muscle weakness (generalized): Secondary | ICD-10-CM | POA: Diagnosis not present

## 2023-04-15 DIAGNOSIS — R293 Abnormal posture: Secondary | ICD-10-CM | POA: Diagnosis not present

## 2023-04-15 DIAGNOSIS — M545 Low back pain, unspecified: Secondary | ICD-10-CM | POA: Diagnosis not present

## 2023-04-21 DIAGNOSIS — R293 Abnormal posture: Secondary | ICD-10-CM | POA: Diagnosis not present

## 2023-04-21 DIAGNOSIS — R2681 Unsteadiness on feet: Secondary | ICD-10-CM | POA: Diagnosis not present

## 2023-04-21 DIAGNOSIS — M47816 Spondylosis without myelopathy or radiculopathy, lumbar region: Secondary | ICD-10-CM | POA: Diagnosis not present

## 2023-04-21 DIAGNOSIS — M6281 Muscle weakness (generalized): Secondary | ICD-10-CM | POA: Diagnosis not present

## 2023-04-21 DIAGNOSIS — M545 Low back pain, unspecified: Secondary | ICD-10-CM | POA: Diagnosis not present

## 2023-04-23 DIAGNOSIS — M47819 Spondylosis without myelopathy or radiculopathy, site unspecified: Secondary | ICD-10-CM | POA: Diagnosis not present

## 2023-04-23 DIAGNOSIS — G894 Chronic pain syndrome: Secondary | ICD-10-CM | POA: Diagnosis not present

## 2023-04-23 DIAGNOSIS — M549 Dorsalgia, unspecified: Secondary | ICD-10-CM | POA: Diagnosis not present

## 2023-04-23 DIAGNOSIS — G249 Dystonia, unspecified: Secondary | ICD-10-CM | POA: Diagnosis not present

## 2023-04-23 DIAGNOSIS — M25559 Pain in unspecified hip: Secondary | ICD-10-CM | POA: Diagnosis not present

## 2023-04-28 DIAGNOSIS — M47816 Spondylosis without myelopathy or radiculopathy, lumbar region: Secondary | ICD-10-CM | POA: Diagnosis not present

## 2023-04-28 DIAGNOSIS — R293 Abnormal posture: Secondary | ICD-10-CM | POA: Diagnosis not present

## 2023-04-28 DIAGNOSIS — M545 Low back pain, unspecified: Secondary | ICD-10-CM | POA: Diagnosis not present

## 2023-04-28 DIAGNOSIS — R2681 Unsteadiness on feet: Secondary | ICD-10-CM | POA: Diagnosis not present

## 2023-04-28 DIAGNOSIS — M6281 Muscle weakness (generalized): Secondary | ICD-10-CM | POA: Diagnosis not present

## 2023-05-06 DIAGNOSIS — M47816 Spondylosis without myelopathy or radiculopathy, lumbar region: Secondary | ICD-10-CM | POA: Diagnosis not present

## 2023-05-06 DIAGNOSIS — M6281 Muscle weakness (generalized): Secondary | ICD-10-CM | POA: Diagnosis not present

## 2023-05-06 DIAGNOSIS — R2681 Unsteadiness on feet: Secondary | ICD-10-CM | POA: Diagnosis not present

## 2023-05-06 DIAGNOSIS — R293 Abnormal posture: Secondary | ICD-10-CM | POA: Diagnosis not present

## 2023-05-06 DIAGNOSIS — M545 Low back pain, unspecified: Secondary | ICD-10-CM | POA: Diagnosis not present

## 2023-05-08 DIAGNOSIS — M545 Low back pain, unspecified: Secondary | ICD-10-CM | POA: Diagnosis not present

## 2023-05-08 DIAGNOSIS — M6281 Muscle weakness (generalized): Secondary | ICD-10-CM | POA: Diagnosis not present

## 2023-05-08 DIAGNOSIS — M47816 Spondylosis without myelopathy or radiculopathy, lumbar region: Secondary | ICD-10-CM | POA: Diagnosis not present

## 2023-05-08 DIAGNOSIS — R2681 Unsteadiness on feet: Secondary | ICD-10-CM | POA: Diagnosis not present

## 2023-05-08 DIAGNOSIS — R293 Abnormal posture: Secondary | ICD-10-CM | POA: Diagnosis not present

## 2023-05-13 DIAGNOSIS — M6281 Muscle weakness (generalized): Secondary | ICD-10-CM | POA: Diagnosis not present

## 2023-05-13 DIAGNOSIS — R2681 Unsteadiness on feet: Secondary | ICD-10-CM | POA: Diagnosis not present

## 2023-05-13 DIAGNOSIS — M545 Low back pain, unspecified: Secondary | ICD-10-CM | POA: Diagnosis not present

## 2023-05-13 DIAGNOSIS — R293 Abnormal posture: Secondary | ICD-10-CM | POA: Diagnosis not present

## 2023-05-13 DIAGNOSIS — M47816 Spondylosis without myelopathy or radiculopathy, lumbar region: Secondary | ICD-10-CM | POA: Diagnosis not present

## 2023-05-20 DIAGNOSIS — M47816 Spondylosis without myelopathy or radiculopathy, lumbar region: Secondary | ICD-10-CM | POA: Diagnosis not present

## 2023-05-20 DIAGNOSIS — R2681 Unsteadiness on feet: Secondary | ICD-10-CM | POA: Diagnosis not present

## 2023-05-20 DIAGNOSIS — R293 Abnormal posture: Secondary | ICD-10-CM | POA: Diagnosis not present

## 2023-05-20 DIAGNOSIS — M6281 Muscle weakness (generalized): Secondary | ICD-10-CM | POA: Diagnosis not present

## 2023-05-26 DIAGNOSIS — R293 Abnormal posture: Secondary | ICD-10-CM | POA: Diagnosis not present

## 2023-05-26 DIAGNOSIS — R2681 Unsteadiness on feet: Secondary | ICD-10-CM | POA: Diagnosis not present

## 2023-05-26 DIAGNOSIS — M47816 Spondylosis without myelopathy or radiculopathy, lumbar region: Secondary | ICD-10-CM | POA: Diagnosis not present

## 2023-05-26 DIAGNOSIS — M6281 Muscle weakness (generalized): Secondary | ICD-10-CM | POA: Diagnosis not present

## 2023-06-25 DIAGNOSIS — M47816 Spondylosis without myelopathy or radiculopathy, lumbar region: Secondary | ICD-10-CM | POA: Diagnosis not present

## 2023-07-17 DIAGNOSIS — K5909 Other constipation: Secondary | ICD-10-CM | POA: Diagnosis not present

## 2023-08-01 DIAGNOSIS — R1030 Lower abdominal pain, unspecified: Secondary | ICD-10-CM | POA: Diagnosis not present

## 2023-08-01 DIAGNOSIS — R112 Nausea with vomiting, unspecified: Secondary | ICD-10-CM | POA: Diagnosis not present

## 2023-08-01 DIAGNOSIS — R109 Unspecified abdominal pain: Secondary | ICD-10-CM | POA: Diagnosis not present

## 2023-08-01 DIAGNOSIS — Z1152 Encounter for screening for COVID-19: Secondary | ICD-10-CM | POA: Diagnosis not present

## 2023-08-04 DIAGNOSIS — M47816 Spondylosis without myelopathy or radiculopathy, lumbar region: Secondary | ICD-10-CM | POA: Diagnosis not present

## 2023-09-04 DIAGNOSIS — Z79891 Long term (current) use of opiate analgesic: Secondary | ICD-10-CM | POA: Diagnosis not present

## 2023-09-04 DIAGNOSIS — M47816 Spondylosis without myelopathy or radiculopathy, lumbar region: Secondary | ICD-10-CM | POA: Diagnosis not present

## 2023-09-04 DIAGNOSIS — G8929 Other chronic pain: Secondary | ICD-10-CM | POA: Diagnosis not present

## 2023-09-06 ENCOUNTER — Other Ambulatory Visit: Payer: Self-pay | Admitting: Cardiology

## 2023-09-08 ENCOUNTER — Other Ambulatory Visit: Payer: Self-pay | Admitting: Cardiology

## 2023-10-07 ENCOUNTER — Other Ambulatory Visit: Payer: Self-pay | Admitting: Cardiology

## 2023-10-08 ENCOUNTER — Other Ambulatory Visit: Payer: Self-pay | Admitting: Cardiology

## 2023-10-08 ENCOUNTER — Telehealth: Payer: Self-pay | Admitting: *Deleted

## 2023-10-08 MED ORDER — METOPROLOL SUCCINATE ER 25 MG PO TB24: 25.0000 mg | ORAL_TABLET | Freq: Every day | ORAL | 0 refills | Status: DC

## 2023-10-08 NOTE — Telephone Encounter (Signed)
 Rx refill sent to pharmacy.

## 2023-10-09 DIAGNOSIS — M47816 Spondylosis without myelopathy or radiculopathy, lumbar region: Secondary | ICD-10-CM | POA: Diagnosis not present

## 2023-10-17 DIAGNOSIS — G8929 Other chronic pain: Secondary | ICD-10-CM | POA: Diagnosis not present

## 2023-10-17 DIAGNOSIS — M47816 Spondylosis without myelopathy or radiculopathy, lumbar region: Secondary | ICD-10-CM | POA: Diagnosis not present

## 2023-10-28 DIAGNOSIS — N390 Urinary tract infection, site not specified: Secondary | ICD-10-CM | POA: Diagnosis not present

## 2023-11-10 ENCOUNTER — Other Ambulatory Visit: Payer: Self-pay | Admitting: Cardiology

## 2023-11-18 DIAGNOSIS — M47816 Spondylosis without myelopathy or radiculopathy, lumbar region: Secondary | ICD-10-CM | POA: Diagnosis not present

## 2023-11-18 DIAGNOSIS — G8929 Other chronic pain: Secondary | ICD-10-CM | POA: Diagnosis not present

## 2023-11-20 DIAGNOSIS — G8929 Other chronic pain: Secondary | ICD-10-CM | POA: Diagnosis not present

## 2023-11-20 DIAGNOSIS — M47816 Spondylosis without myelopathy or radiculopathy, lumbar region: Secondary | ICD-10-CM | POA: Diagnosis not present

## 2023-11-28 DIAGNOSIS — N390 Urinary tract infection, site not specified: Secondary | ICD-10-CM | POA: Diagnosis not present

## 2023-11-28 DIAGNOSIS — R300 Dysuria: Secondary | ICD-10-CM | POA: Diagnosis not present

## 2023-12-26 ENCOUNTER — Other Ambulatory Visit: Payer: Self-pay | Admitting: Cardiology

## 2024-01-30 ENCOUNTER — Other Ambulatory Visit: Payer: Self-pay | Admitting: Cardiology
